# Patient Record
Sex: Female | Born: 1970
Health system: Southern US, Community
[De-identification: ages and names within clinical notes are randomized; demographics above are authoritative.]

## PROBLEM LIST (undated history)

## (undated) DIAGNOSIS — R079 Chest pain, unspecified: Secondary | ICD-10-CM

## (undated) DIAGNOSIS — D649 Anemia, unspecified: Secondary | ICD-10-CM

## (undated) DIAGNOSIS — R011 Cardiac murmur, unspecified: Secondary | ICD-10-CM

## (undated) DIAGNOSIS — Z8632 Personal history of gestational diabetes: Secondary | ICD-10-CM

## (undated) DIAGNOSIS — I82409 Acute embolism and thrombosis of unspecified deep veins of unspecified lower extremity: Secondary | ICD-10-CM

## (undated) DIAGNOSIS — I10 Essential (primary) hypertension: Secondary | ICD-10-CM

## (undated) DIAGNOSIS — N939 Abnormal uterine and vaginal bleeding, unspecified: Secondary | ICD-10-CM

## (undated) HISTORY — DX: Personal history of gestational diabetes: Z86.32

## (undated) HISTORY — DX: Chest pain, unspecified: R07.9

---
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
## 1997-05-04 ENCOUNTER — Inpatient Hospital Stay (HOSPITAL_COMMUNITY): Admission: AD | Admit: 1997-05-04 | Discharge: 1997-05-04 | Payer: Self-pay | Admitting: Gynecology

## 1997-09-30 ENCOUNTER — Encounter: Admission: RE | Admit: 1997-09-30 | Discharge: 1997-12-29 | Payer: Self-pay | Admitting: Gynecology

## 1998-01-01 ENCOUNTER — Encounter: Admission: RE | Admit: 1998-01-01 | Discharge: 1998-04-01 | Payer: Self-pay | Admitting: Obstetrics and Gynecology

## 1998-01-23 HISTORY — PX: TUBAL LIGATION: SHX77

## 1998-03-18 ENCOUNTER — Inpatient Hospital Stay (HOSPITAL_COMMUNITY): Admission: AD | Admit: 1998-03-18 | Discharge: 1998-03-20 | Payer: Self-pay | Admitting: Obstetrics and Gynecology

## 1998-04-21 ENCOUNTER — Other Ambulatory Visit: Admission: RE | Admit: 1998-04-21 | Discharge: 1998-04-21 | Payer: Self-pay | Admitting: Gynecology

## 2000-10-25 ENCOUNTER — Other Ambulatory Visit: Admission: RE | Admit: 2000-10-25 | Discharge: 2000-10-25 | Payer: Self-pay | Admitting: Gynecology

## 2001-11-15 ENCOUNTER — Other Ambulatory Visit: Admission: RE | Admit: 2001-11-15 | Discharge: 2001-11-15 | Payer: Self-pay | Admitting: Gynecology

## 2003-12-10 ENCOUNTER — Other Ambulatory Visit: Admission: RE | Admit: 2003-12-10 | Discharge: 2003-12-10 | Payer: Self-pay | Admitting: Gynecology

## 2005-01-06 ENCOUNTER — Other Ambulatory Visit: Admission: RE | Admit: 2005-01-06 | Discharge: 2005-01-06 | Payer: Self-pay | Admitting: Gynecology

## 2005-08-03 ENCOUNTER — Other Ambulatory Visit: Admission: RE | Admit: 2005-08-03 | Discharge: 2005-08-03 | Payer: Self-pay | Admitting: Gynecology

## 2006-01-11 ENCOUNTER — Other Ambulatory Visit: Admission: RE | Admit: 2006-01-11 | Discharge: 2006-01-11 | Payer: Self-pay | Admitting: Gynecology

## 2007-03-28 ENCOUNTER — Other Ambulatory Visit: Admission: RE | Admit: 2007-03-28 | Discharge: 2007-03-28 | Payer: Self-pay | Admitting: Gynecology

## 2009-03-17 ENCOUNTER — Other Ambulatory Visit: Admission: RE | Admit: 2009-03-17 | Discharge: 2009-03-17 | Payer: Self-pay | Admitting: Gynecology

## 2009-03-17 ENCOUNTER — Ambulatory Visit: Payer: Self-pay | Admitting: Gynecology

## 2010-03-18 ENCOUNTER — Encounter (INDEPENDENT_AMBULATORY_CARE_PROVIDER_SITE_OTHER): Payer: 59 | Admitting: Gynecology

## 2010-03-18 ENCOUNTER — Other Ambulatory Visit: Payer: Self-pay | Admitting: Gynecology

## 2010-03-18 ENCOUNTER — Other Ambulatory Visit (HOSPITAL_COMMUNITY)
Admission: RE | Admit: 2010-03-18 | Discharge: 2010-03-18 | Disposition: A | Discharge status: Home / Self Care | Payer: 59 | Source: Ambulatory Visit | Attending: Gynecology | Admitting: Gynecology

## 2010-03-18 DIAGNOSIS — R823 Hemoglobinuria: Secondary | ICD-10-CM

## 2010-03-18 DIAGNOSIS — Z13220 Encounter for screening for lipoid disorders: Secondary | ICD-10-CM

## 2010-03-18 DIAGNOSIS — Z124 Encounter for screening for malignant neoplasm of cervix: Secondary | ICD-10-CM | POA: Insufficient documentation

## 2010-03-18 DIAGNOSIS — Z01419 Encounter for gynecological examination (general) (routine) without abnormal findings: Secondary | ICD-10-CM

## 2010-03-18 DIAGNOSIS — N920 Excessive and frequent menstruation with regular cycle: Secondary | ICD-10-CM

## 2010-03-18 DIAGNOSIS — Z833 Family history of diabetes mellitus: Secondary | ICD-10-CM

## 2010-04-14 ENCOUNTER — Other Ambulatory Visit: Payer: Self-pay | Admitting: Gynecology

## 2010-04-14 DIAGNOSIS — Z1231 Encounter for screening mammogram for malignant neoplasm of breast: Secondary | ICD-10-CM

## 2010-04-18 ENCOUNTER — Ambulatory Visit
Admission: RE | Admit: 2010-04-18 | Discharge: 2010-04-18 | Disposition: A | Discharge status: Home / Self Care | Payer: 59 | Source: Ambulatory Visit | Attending: Gynecology | Admitting: Gynecology

## 2010-04-18 DIAGNOSIS — Z1231 Encounter for screening mammogram for malignant neoplasm of breast: Secondary | ICD-10-CM

## 2011-05-11 ENCOUNTER — Ambulatory Visit (INDEPENDENT_AMBULATORY_CARE_PROVIDER_SITE_OTHER): Payer: 59 | Admitting: Gynecology

## 2011-05-11 ENCOUNTER — Encounter: Payer: Self-pay | Admitting: Gynecology

## 2011-05-11 VITALS — BP 134/84 | Ht 67.5 in | Wt 187.0 lb

## 2011-05-11 DIAGNOSIS — Z01419 Encounter for gynecological examination (general) (routine) without abnormal findings: Secondary | ICD-10-CM

## 2011-05-11 DIAGNOSIS — D259 Leiomyoma of uterus, unspecified: Secondary | ICD-10-CM

## 2011-05-11 DIAGNOSIS — R638 Other symptoms and signs concerning food and fluid intake: Secondary | ICD-10-CM

## 2011-05-11 DIAGNOSIS — Z8632 Personal history of gestational diabetes: Secondary | ICD-10-CM | POA: Insufficient documentation

## 2011-05-11 LAB — CBC WITH DIFFERENTIAL/PLATELET
Basophils Relative: 0 % (ref 0–1)
Eosinophils Absolute: 0.2 10*3/uL (ref 0.0–0.7)
Hemoglobin: 12.4 g/dL (ref 12.0–15.0)
MCH: 31.3 pg (ref 26.0–34.0)
MCHC: 34 g/dL (ref 30.0–36.0)
Monocytes Relative: 4 % (ref 3–12)
Neutrophils Relative %: 39 % — ABNORMAL LOW (ref 43–77)

## 2011-05-11 LAB — HEMOGLOBIN A1C: Hgb A1c MFr Bld: 5.3 % (ref <5.7)

## 2011-05-11 LAB — CHOLESTEROL, TOTAL: Cholesterol: 133 mg/dL (ref 0–200)

## 2011-05-11 LAB — TSH: TSH: 0.992 u[IU]/mL (ref 0.350–4.500)

## 2011-05-11 NOTE — Patient Instructions (Signed)
Fibroids You have been diagnosed as having a fibroid. Fibroids are smooth muscle lumps (tumors) which can occur any place in a woman's body. They are usually in the womb (uterus). The most common problem (symptom) of fibroids is bleeding. Over time this may cause low red blood cells (anemia). Other symptoms include feelings of pressure and pain in the pelvis. The diagnosis (learning what is wrong) of fibroids is made by physical exam. Sometimes tests such as an ultrasound are used. This is helpful when fibroids are felt around the ovaries and to look for tumors. TREATMENT   Most fibroids do not need surgical or medical treatment. Sometimes a tissue sample (biopsy) of the lining of the uterus is done to rule out cancer. If there is no cancer and only a small amount of bleeding, the problem can be watched.   Hormonal treatment can improve the problem.   When surgery is needed, it can consist of removing the fibroid. Vaginal birth may not be possible after the removal of fibroids. This depends on where they are and the extent of surgery. When pregnancy occurs with fibroids it is usually normal.   Your caregiver can help decide which treatments are best for you.  HOME CARE INSTRUCTIONS   Do not use aspirin as this may increase bleeding problems.   If your periods (menses) are heavy, record the number of pads or tampons used per month. Bring this information to your caregiver. This can help them determine the best treatment for you.  SEEK IMMEDIATE MEDICAL CARE IF:  You have pelvic pain or cramps not controlled with medications, or experience a sudden increase in pain.   You have an increase of pelvic bleeding between and during menses.   You feel lightheaded or have fainting spells.   You develop worsening belly (abdominal) pain.  Document Released: 01/07/2000 Document Revised: 12/29/2010 Document Reviewed: 08/29/2007 [facility] Patient Information 2012 [place], [place].  Transvaginal  Ultrasound Transvaginal ultrasound is a pelvic ultrasound, using a metal probe that is placed in the vagina, to look at a women's female organs. Transvaginal ultrasound is a method of seeing inside the pelvis of a woman. The ultrasound machine sends out sound waves from the transducer (probe). These sound waves bounce off body structures (like an echo) to create a picture. The picture shows up on a monitor. It is called transvaginal because the probe is inserted into the vagina. There should be very little discomfort from the vaginal probe. This test can also be used during pregnancy. Endovaginal ultrasound is another name for a transvaginal ultrasound. In a transabdominal ultrasound, the probe is placed on the outside of the belly. This method gives pictures that are lower quality than pictures from the transvaginal technique. Transvaginal ultrasound is used to look for problems of the female genital tract. Some such problems include:  Infertility problems.   Congenital (birth defect) malformations of the uterus and ovaries.   Tumors in the uterus.   Abnormal bleeding.   Ovarian tumors and cysts.   Abscess (inflamed tissue around pus) in the pelvis.   Unexplained abdominal or pelvic pain.   Pelvic infection.  DURING PREGNANCY, TRANSVAGINAL ULTRASOUND MAY BE USED TO LOOK AT:  Normal pregnancy.   Ectopic pregnancy (pregnancy outside the uterus).   Fetal heartbeat.   Abnormalities in the pelvis, that are not seen well with transabdominal ultrasound.   Suspected twins or multiples.   Impending miscarriage.   Problems with the cervix (incompetent cervix, not able to stay closed and hold  the baby).   When doing an amniocentesis (removing fluid from the pregnancy sac, for testing).   Looking for abnormalities of the baby.   Checking the growth, development, and age of the fetus.   Measuring the amount of fluid in the amniotic sac.   When doing an external version of the baby  (moving baby into correct position).   Evaluating the baby for problems in high risk pregnancies (biophysical profile).   Suspected fetal demise (death).  Sometimes a special ultrasound method called Saline Infusion Sonography (SIS) is used for a more accurate look at the uterus. Sterile saline (salt water) is injected into the uterus of non-pregnant patients to see the inside of the uterus better. SIS is not used on pregnant women. The vaginal probe can also assist in obtaining biopsies of abnormal areas, in draining fluid from cysts on the ovary, and in finding IUDs (intrauterine device, birth control) that cannot be located. PREPARATION FOR TEST A transvaginal ultrasound is done with the bladder empty. The transabdominal ultrasound is done with your bladder full. You may be asked to drink several glasses of water before that exam. Sometimes, a transabdominal ultrasound is done just after a transvaginal ultrasound, to look at organs in your abdomen. PROCEDURE  You will lie down on a table, with your knees bent and your feet in foot holders. The probe is covered with a condom. A sterile lubricant is put into the vagina and on the probe. The lubricant helps transmit the sound waves and avoid irritating the vagina. Your caregiver will move the probe inside the vaginal cavity to scan the pelvic structures. A normal test will show a normal pelvis and normal contents. An abnormal test will show abnormalities of the pelvis, placenta, or baby. ABNORMAL RESULTS MAY BE DUE TO:  Growths or tumors in the:   Uterus.   Ovaries.   Vagina.   Other pelvic structures.   Non-cancerous growths of the uterus and ovaries.   Twisting of the ovary, cutting off blood supply to the ovary (ovarian torsion).   Areas of infection, including:   Pelvic inflammatory disease.   Abscess in the pelvis.   Locating an IUD.  PROBLEMS FOUND IN PREGNANT WOMEN MAY INCLUDE:  Ectopic pregnancy (pregnancy outside the  uterus).   Multiple pregnancies.   Early dilation (opening) of the cervix. This may indicate an incompetent cervix and early delivery.   Impending miscarriage.   Fetal death.   Problems with the placenta, including:   Placenta has grown over the opening of the womb (placenta previa).   Placenta has separated early in the womb (placental abruption).   Placenta grows into the muscle of the uterus (placenta accreta).   Tumors of pregnancy, including gestational trophoblastic disease. This is an abnormal pregnancy, with no fetus. The uterus is filled with many grape-like cysts that could sometimes be cancerous.   Incorrect position of the fetus (breech, vertex).   Intrauterine fetal growth retardation (IUGR) (poor growth in the womb).   Fetal abnormalities or infection.  RISKS AND COMPLICATIONS There are no known risks to the ultrasound procedure. There is no X-ray used when doing an ultrasound. Document Released: 12/22/2003 Document Revised: 12/29/2010 Document Reviewed: 12/09/2008 [facility] Patient Information 2012 [place], [place].  Exercise to Lose Weight Exercise and a healthy diet may help you lose weight. Your doctor may suggest specific exercises. EXERCISE IDEAS AND TIPS  Choose low-cost things you enjoy doing, such as walking, bicycling, or exercising to workout videos.   Take stairs  instead of the elevator.   Walk during your lunch break.   Park your car further away from work or school.   Go to a gym or an exercise class.   Start with 5 to 10 minutes of exercise each day. Build up to 30 minutes of exercise 4 to 6 days a week.   Wear shoes with good support and comfortable clothes.   Stretch before and after working out.   Work out until you breathe harder and your heart beats faster.   Drink extra water when you exercise.   Do not do so much that you hurt yourself, feel dizzy, or get very short of breath.  Exercises that burn about 150 calories:  Running  1  miles in 15 minutes.   Playing volleyball for 45 to 60 minutes.   Washing and waxing a car for 45 to 60 minutes.   Playing touch football for 45 minutes.   Walking 1  miles in 35 minutes.   Pushing a stroller 1  miles in 30 minutes.   Playing basketball for 30 minutes.   Raking leaves for 30 minutes.   Bicycling 5 miles in 30 minutes.   Walking 2 miles in 30 minutes.   Dancing for 30 minutes.   Shoveling snow for 15 minutes.   Swimming laps for 20 minutes.   Walking up stairs for 15 minutes.   Bicycling 4 miles in 15 minutes.   Gardening for 30 to 45 minutes.   Jumping rope for 15 minutes.   Washing windows or floors for 45 to 60 minutes.  Document Released: 02/11/2010 Document Revised: 09/21/2010 Document Reviewed: 02/11/2010 [facility] Patient Information 2012 [place], [place].

## 2011-05-11 NOTE — Progress Notes (Signed)
Samantha Moon 04-Jan-1971 [id number]   History:    41 y.o.  for annual exam with no complaints. Patient is still overweight with a BMI 28.86. Her menstrual cycles reported to be regular although that last 6 days but she said that is not as heavy as it was before. She has a previous tubal sterilization procedure done. She also had gestational diabetes in one of her pregnancies.  Her last mammogram was in March of 2012 and she does her monthly self breast examination.  Past medical history,surgical history, family history and social history were all reviewed and documented in the EPIC chart.  Gynecologic History Patient's last menstrual period was 04/18/2011. Contraception: condoms Last Pap: 2012. Results were: normal Last mammogram: 2012. Results were: normal  Obstetric History OB History    Grav Para Term Preterm Abortions TAB SAB Ect Mult Living   3 2   1  1   2      # Outc Date GA Lbr Len/2nd Wgt Sex Del Anes PTL Lv   1 PAR            2 PAR            3 SAB                ROS:  Was performed and pertinent positives and negatives are included in the history.  Exam: chaperone present  BP 134/84  Ht 5' 7.5" (1.715 m)  Wt 187 lb (84.823 kg)  BMI 28.86 kg/m2  LMP 04/18/2011  Body mass index is 28.86 kg/(m^2).  General appearance : Well developed well nourished female. No acute distress HEENT: Neck supple, trachea midline, no carotid bruits, no thyroidmegaly Lungs: Clear to auscultation, no rhonchi or wheezes, or rib retractions  Heart: Regular rate and rhythm, no murmurs or gallops Breast:Examined in sitting and supine position were symmetrical in appearance, no palpable masses or tenderness,  no skin retraction, no nipple inversion, no nipple discharge, no skin discoloration, no axillary or supraclavicular lymphadenopathy Abdomen: no palpable masses or tenderness, no rebound or guarding Extremities: no edema or skin discoloration or tenderness  Pelvic:  Bartholin, Urethra,  Skene Glands: Within normal limits             Vagina: No gross lesions or discharge  Cervix: No gross lesions or discharge  Uterus  anteverted with irregular T. noted at the fundus questionable fibroid  Adnexa  Without masses or tenderness  Anus and perineum  normal   Rectovaginal  normal sphincter tone without palpated masses or tenderness             Hemoccult not done     Assessment/Plan:  41 y.o. female for annual exam with apparent fibroid uterus. She will return back next week for an ultrasound for better assessment of her uterus and adnexa. Because of her past history of gestational diabetes and currently nonfasting we'll do a hemoglobin A1c today along with her CBC cholesterol and urinalysis. New screening guidelines for Pap smear were discussed and she will not need one for 2 more years. She was instructed to do her monthly self breast examination and to schedule her mammogram which is overdue.    Ok Edwards MD, 3:55 PM 05/11/2011

## 2011-05-12 ENCOUNTER — Other Ambulatory Visit: Payer: Self-pay | Admitting: *Deleted

## 2011-05-12 ENCOUNTER — Ambulatory Visit (INDEPENDENT_AMBULATORY_CARE_PROVIDER_SITE_OTHER): Payer: 59

## 2011-05-12 ENCOUNTER — Encounter: Payer: Self-pay | Admitting: Gynecology

## 2011-05-12 ENCOUNTER — Ambulatory Visit (INDEPENDENT_AMBULATORY_CARE_PROVIDER_SITE_OTHER): Payer: 59 | Admitting: Gynecology

## 2011-05-12 DIAGNOSIS — R3129 Other microscopic hematuria: Secondary | ICD-10-CM

## 2011-05-12 DIAGNOSIS — R7989 Other specified abnormal findings of blood chemistry: Secondary | ICD-10-CM

## 2011-05-12 DIAGNOSIS — D259 Leiomyoma of uterus, unspecified: Secondary | ICD-10-CM

## 2011-05-12 DIAGNOSIS — R8290 Unspecified abnormal findings in urine: Secondary | ICD-10-CM

## 2011-05-12 LAB — URINALYSIS W MICROSCOPIC + REFLEX CULTURE
Bacteria, UA: NONE SEEN
Casts: NONE SEEN
Glucose, UA: NEGATIVE mg/dL
Ketones, ur: NEGATIVE mg/dL
Nitrite: NEGATIVE
pH: 6 (ref 5.0–8.0)

## 2011-05-12 LAB — CBC WITH DIFFERENTIAL/PLATELET
Basophils Relative: 0 % (ref 0–1)
Eosinophils Absolute: 0.2 10*3/uL (ref 0.0–0.7)
HCT: 35.1 % — ABNORMAL LOW (ref 36.0–46.0)
Hemoglobin: 12 g/dL (ref 12.0–15.0)
Lymphs Abs: 2.1 10*3/uL (ref 0.7–4.0)
MCH: 30.9 pg (ref 26.0–34.0)
MCHC: 34.2 g/dL (ref 30.0–36.0)
Monocytes Absolute: 0.3 10*3/uL (ref 0.1–1.0)
Monocytes Relative: 6 % (ref 3–12)

## 2011-05-12 NOTE — Progress Notes (Signed)
Patient 41 year old was seen in the office on April 18 for her annual gynecological examination. Patient was asymptomatic but during the examination an irregularity was noted at the fundus of the uterus and she was asked to return today for an ultrasound.  Ultrasound report: Uterus measured 10.7 x 7.7 x 6 cm endometrial stripe 16.5 mm (patient is on day 23 of her cycle) a single anterior fibroid measuring 19 x 11 mm was noted otherwise normal ovaries and no free fluid the: Sac was noted.  Her labs were as follows her hemoglobin A1c was normal total cholesterol was normal CBC was normal with the exception of slightly decreased neutrophil and slight elevation of lymphocytes for which she is having a repeat CBC today. Her urinalysis also had demonstrated some evidence of microscopic hematuria and urinalysis will be repeated today. Will notify with the results of there is any abnormality. She was reassured and we'll see her back in one year or when necessary. She'll be reminded also to schedule her mammogram accordingly.

## 2011-05-12 NOTE — Patient Instructions (Signed)
Remember to schedule your mammogram 

## 2012-05-16 ENCOUNTER — Ambulatory Visit (INDEPENDENT_AMBULATORY_CARE_PROVIDER_SITE_OTHER): Payer: BC Managed Care – PPO | Admitting: Gynecology

## 2012-05-16 ENCOUNTER — Encounter: Payer: Self-pay | Admitting: Gynecology

## 2012-05-16 VITALS — BP 142/90 | Ht 67.0 in | Wt 187.0 lb

## 2012-05-16 DIAGNOSIS — Z8632 Personal history of gestational diabetes: Secondary | ICD-10-CM

## 2012-05-16 DIAGNOSIS — R635 Abnormal weight gain: Secondary | ICD-10-CM

## 2012-05-16 DIAGNOSIS — Z01419 Encounter for gynecological examination (general) (routine) without abnormal findings: Secondary | ICD-10-CM

## 2012-05-16 DIAGNOSIS — Z23 Encounter for immunization: Secondary | ICD-10-CM

## 2012-05-16 LAB — CBC WITH DIFFERENTIAL/PLATELET
Eosinophils Absolute: 0.3 10*3/uL (ref 0.0–0.7)
Eosinophils Relative: 5 % (ref 0–5)
HCT: 35.2 % — ABNORMAL LOW (ref 36.0–46.0)
Lymphs Abs: 2.8 10*3/uL (ref 0.7–4.0)
MCH: 30.9 pg (ref 26.0–34.0)
MCV: 90 fL (ref 78.0–100.0)
Monocytes Absolute: 0.4 10*3/uL (ref 0.1–1.0)
Platelets: 375 10*3/uL (ref 150–400)
RBC: 3.91 MIL/uL (ref 3.87–5.11)
RDW: 13.9 % (ref 11.5–15.5)

## 2012-05-16 LAB — CHOLESTEROL, TOTAL: Cholesterol: 130 mg/dL (ref 0–200)

## 2012-05-16 NOTE — Progress Notes (Signed)
Samantha Moon 12-04-1970 [id number]   History:    42 y.o.  for annual gyn exam with no complaints today. Patient with prior tubal sterilization procedure. Cycles reported to be regular. Last mammogram 2012. Patient does not examine her breasts are regular basis. Patient had history of gestational diabetes. Patient would know prior history of abnormal Pap smears. Patient uncertain of Tdap status.  Past medical history,surgical history, family history and social history were all reviewed and documented in the EPIC chart.  Gynecologic History Patient's last menstrual period was 05/09/2012. Contraception: tubal ligation Last Pap: 2012. Results were: normal Last mammogram: 2012. Results were: normal  Obstetric History OB History   Grav Para Term Preterm Abortions TAB SAB Ect Mult Living   3 2   1  1   2      # Outc Date GA Lbr Len/2nd Wgt Sex Del Anes PTL Lv   1 PAR            2 PAR            3 SAB                ROS: A ROS was performed and pertinent positives and negatives are included in the history.  GENERAL: No fevers or chills. HEENT: No change in vision, no earache, sore throat or sinus congestion. NECK: No pain or stiffness. CARDIOVASCULAR: No chest pain or pressure. No palpitations. PULMONARY: No shortness of breath, cough or wheeze. GASTROINTESTINAL: No abdominal pain, nausea, vomiting or diarrhea, melena or bright red blood per rectum. GENITOURINARY: No urinary frequency, urgency, hesitancy or dysuria. MUSCULOSKELETAL: No joint or muscle pain, no back pain, no recent trauma. DERMATOLOGIC: No rash, no itching, no lesions. ENDOCRINE: No polyuria, polydipsia, no heat or cold intolerance. No recent change in weight. HEMATOLOGICAL: No anemia or easy bruising or bleeding. NEUROLOGIC: No headache, seizures, numbness, tingling or weakness. PSYCHIATRIC: No depression, no loss of interest in normal activity or change in sleep pattern.     Exam: chaperone present  BP 142/90  Ht 5\' 7"   (1.702 m)  Wt 187 lb (84.823 kg)  BMI 29.28 kg/m2  LMP 05/09/2012  Body mass index is 29.28 kg/(m^2).  General appearance : Well developed well nourished female. No acute distress HEENT: Neck supple, trachea midline, no carotid bruits, no thyroidmegaly Lungs: Clear to auscultation, no rhonchi or wheezes, or rib retractions  Heart: Regular rate and rhythm, no murmurs or gallops Breast:Examined in sitting and supine position were symmetrical in appearance, no palpable masses or tenderness,  no skin retraction, no nipple inversion, no nipple discharge, no skin discoloration, no axillary or supraclavicular lymphadenopathy Abdomen: no palpable masses or tenderness, no rebound or guarding Extremities: no edema or skin discoloration or tenderness  Pelvic:  Bartholin, Urethra, Skene Glands: Within normal limits             Vagina: No gross lesions or discharge  Cervix: No gross lesions or discharge  Uterus  Upper limits of normal 10 week size, normal  shape and consistency, non-tender and mobile  Adnexa  Without masses or tenderness  Anus and perineum  normal   Rectovaginal  normal sphincter tone without palpated masses or tenderness             Hemoccult that indicated     Assessment/Plan:  42 y.o. female for annual exam will receive her Tdap vaccine today. The following labs her were: Hemoglobin A1c, TSH, screening cholesterol, CBC, as well as urinalysis. No Pap smear done  today the new guidelines were discussed. She was reminded on the importance of monthly self breast examination. Requisition was provided for to schedule her mammogram.    Ok Edwards MD, 2:40 PM 05/16/2012

## 2012-05-16 NOTE — Patient Instructions (Addendum)

## 2012-05-16 NOTE — Addendum Note (Signed)
Addended by: Bertram Savin A on: 05/16/2012 03:26 PM   Modules accepted: Orders

## 2012-05-17 ENCOUNTER — Other Ambulatory Visit: Payer: Self-pay | Admitting: Gynecology

## 2012-05-17 DIAGNOSIS — D72820 Lymphocytosis (symptomatic): Secondary | ICD-10-CM

## 2012-05-17 LAB — URINALYSIS W MICROSCOPIC + REFLEX CULTURE
Bacteria, UA: NONE SEEN
Bilirubin Urine: NEGATIVE
Crystals: NONE SEEN
Ketones, ur: NEGATIVE mg/dL
Specific Gravity, Urine: 1.026 (ref 1.005–1.030)
Urobilinogen, UA: 0.2 mg/dL (ref 0.0–1.0)

## 2012-05-17 LAB — URINE CULTURE: Colony Count: 70000

## 2012-05-23 ENCOUNTER — Other Ambulatory Visit: Payer: Self-pay

## 2012-05-23 DIAGNOSIS — Z1231 Encounter for screening mammogram for malignant neoplasm of breast: Secondary | ICD-10-CM

## 2012-06-07 ENCOUNTER — Ambulatory Visit: Payer: 59

## 2012-08-09 ENCOUNTER — Ambulatory Visit
Admission: RE | Admit: 2012-08-09 | Discharge: 2012-08-09 | Disposition: A | Discharge status: Home / Self Care | Payer: BC Managed Care – PPO | Source: Ambulatory Visit

## 2012-08-09 ENCOUNTER — Other Ambulatory Visit: Payer: BC Managed Care – PPO

## 2012-08-09 ENCOUNTER — Ambulatory Visit: Payer: Self-pay

## 2012-08-09 DIAGNOSIS — Z1231 Encounter for screening mammogram for malignant neoplasm of breast: Secondary | ICD-10-CM

## 2012-08-09 DIAGNOSIS — D72820 Lymphocytosis (symptomatic): Secondary | ICD-10-CM

## 2012-08-09 LAB — CBC WITH DIFFERENTIAL/PLATELET
Basophils Relative: 1 % (ref 0–1)
Eosinophils Absolute: 0.2 10*3/uL (ref 0.0–0.7)
HCT: 35.5 % — ABNORMAL LOW (ref 36.0–46.0)
Hemoglobin: 12.4 g/dL (ref 12.0–15.0)
MCH: 31.2 pg (ref 26.0–34.0)
MCHC: 34.9 g/dL (ref 30.0–36.0)
Monocytes Absolute: 0.3 10*3/uL (ref 0.1–1.0)
Monocytes Relative: 6 % (ref 3–12)
Neutrophils Relative %: 41 % — ABNORMAL LOW (ref 43–77)
RDW: 13.6 % (ref 11.5–15.5)

## 2013-11-24 ENCOUNTER — Encounter: Payer: Self-pay | Admitting: Gynecology

## 2014-01-02 ENCOUNTER — Encounter: Payer: Self-pay | Admitting: Gynecology

## 2014-07-31 ENCOUNTER — Other Ambulatory Visit: Payer: Self-pay | Admitting: Family Medicine

## 2014-07-31 DIAGNOSIS — R200 Anesthesia of skin: Secondary | ICD-10-CM

## 2015-04-22 ENCOUNTER — Other Ambulatory Visit: Payer: Self-pay

## 2015-04-22 DIAGNOSIS — Z1231 Encounter for screening mammogram for malignant neoplasm of breast: Secondary | ICD-10-CM

## 2015-05-05 ENCOUNTER — Ambulatory Visit (INDEPENDENT_AMBULATORY_CARE_PROVIDER_SITE_OTHER): Payer: Managed Care, Other (non HMO) | Admitting: Gynecology

## 2015-05-05 ENCOUNTER — Encounter: Payer: Self-pay | Admitting: Gynecology

## 2015-05-05 VITALS — BP 136/88 | Ht 67.25 in | Wt 199.0 lb

## 2015-05-05 DIAGNOSIS — Z8632 Personal history of gestational diabetes: Secondary | ICD-10-CM | POA: Diagnosis not present

## 2015-05-05 DIAGNOSIS — L739 Follicular disorder, unspecified: Secondary | ICD-10-CM | POA: Diagnosis not present

## 2015-05-05 DIAGNOSIS — Z01419 Encounter for gynecological examination (general) (routine) without abnormal findings: Secondary | ICD-10-CM | POA: Diagnosis not present

## 2015-05-05 MED ORDER — CEPHALEXIN 500 MG PO CAPS: 500.0000 mg | ORAL_CAPSULE | Freq: Two times a day (BID) | ORAL | Status: DC

## 2015-05-05 NOTE — Progress Notes (Signed)
Samantha Moon 1970-09-03 [id number]   History:    45 y.o.  for annual gyn exam with a complaint of a small knot in the center of her chest for the past few days. Patient with previous tubal sterilization. Patient otherwise doing well. She has not been seen the office since 2014 and her last Pap smear was in 2012. She reports normal menstrual cycles. Her PCP has been doing her blood work.  Past medical history,surgical history, family history and social history were all reviewed and documented in the EPIC chart.  Gynecologic History Patient's last menstrual period was 05/05/2015. Contraception: tubal ligation Last Pap: 2012. Results were: normal Last mammogram: 2014. Results were: Normal but dense  Obstetric History OB History  Gravida Para Term Preterm AB SAB TAB Ectopic Multiple Living  3 2   1 1    2     # Outcome Date GA Lbr Len/2nd Weight Sex Delivery Anes PTL Lv  3 SAB           2 Para           1 Para                ROS: A ROS was performed and pertinent positives and negatives are included in the history.  GENERAL: No fevers or chills. HEENT: No change in vision, no earache, sore throat or sinus congestion. NECK: No pain or stiffness. CARDIOVASCULAR: No chest pain or pressure. No palpitations. PULMONARY: No shortness of breath, cough or wheeze. GASTROINTESTINAL: No abdominal pain, nausea, vomiting or diarrhea, melena or bright red blood per rectum. GENITOURINARY: No urinary frequency, urgency, hesitancy or dysuria. MUSCULOSKELETAL: No joint or muscle pain, no back pain, no recent trauma. DERMATOLOGIC: No rash, no itching, no lesions. ENDOCRINE: No polyuria, polydipsia, no heat or cold intolerance. No recent change in weight. HEMATOLOGICAL: No anemia or easy bruising or bleeding. NEUROLOGIC: No headache, seizures, numbness, tingling or weakness. PSYCHIATRIC: No depression, no loss of interest in normal activity or change in sleep pattern.     Exam: chaperone present  BP  136/88 mmHg  Ht 5' 7.25" (1.708 m)  Wt 199 lb (90.266 kg)  BMI 30.94 kg/m2  LMP 05/05/2015  Body mass index is 30.94 kg/(m^2).  General appearance : Well developed well nourished female. No acute distress HEENT: Eyes: no retinal hemorrhage or exudates,  Neck supple, trachea midline, no carotid bruits, no thyroidmegaly Lungs: Clear to auscultation, no rhonchi or wheezes, or rib retractions  Heart: Regular rate and rhythm, no murmurs or gallops Breast:Examined in sitting and supine position were symmetrical in appearance, no palpable masses or tenderness,  no skin retraction, no nipple inversion, no nipple discharge, no skin discoloration, no axillary or supraclavicular lymphadenopathy Abdomen: no palpable masses or tenderness, no rebound or guarding Extremities: no edema or skin discoloration or tenderness  Pelvic:  Bartholin, Urethra, Skene Glands: Within normal limits             Vagina: Menstrual blood  Cervix: No gross lesions or discharge  Uterus  anteverted, normal size, shape and consistency, non-tender and mobile  Adnexa  Without masses or tenderness  Anus and perineum  normal   Rectovaginal  normal sphincter tone without palpated masses or tenderness             Hemoccult not indicated   (Sternal border inferior left breast a small epidermal inclusion cysts folliculitis/  Assessment/Plan:  45 y.o. female for annual exam with a small follicular cyst will be treated  with Keflex 500 mg twice a day for 10 days. She'll apply Neosporin to the area as well. She was given a requisition to schedule her overdue mammogram. We will request a three-dimensional mammogram due to the fact that in 2014 her mammogram demonstrated her breasts were dense. Her PCP has been doing her blood work. Pap smear with HPV screening done today. Patient menstruating.  Terrance Mass MD, 4:45 PM 05/05/2015

## 2015-05-05 NOTE — Patient Instructions (Signed)
Folliculitis °Folliculitis is redness, soreness, and swelling (inflammation) of the hair follicles. This condition can occur anywhere on the body. People with weakened immune systems, diabetes, or obesity have a greater risk of getting folliculitis. °CAUSES °· Bacterial infection. This is the most common cause. °· Fungal infection. °· Viral infection. °· Contact with certain chemicals, especially oils and tars. °Long-term folliculitis can result from bacteria that live in the nostrils. The bacteria may trigger multiple outbreaks of folliculitis over time. °SYMPTOMS °Folliculitis most commonly occurs on the scalp, thighs, legs, back, buttocks, and areas where hair is shaved frequently. An early sign of folliculitis is a small, white or yellow, pus-filled, itchy lesion (pustule). These lesions appear on a red, inflamed follicle. They are usually less than 0.2 inches (5 mm) wide. When there is an infection of the follicle that goes deeper, it becomes a boil or furuncle. A group of closely packed boils creates a larger lesion (carbuncle). Carbuncles tend to occur in hairy, sweaty areas of the body. °DIAGNOSIS  °Your caregiver can usually tell what is wrong by doing a physical exam. A sample may be taken from one of the lesions and tested in a lab. This can help determine what is causing your folliculitis. °TREATMENT  °Treatment may include: °· Applying warm compresses to the affected areas. °· Taking antibiotic medicines orally or applying them to the skin. °· Draining the lesions if they contain a large amount of pus or fluid. °· Laser hair removal for cases of long-lasting folliculitis. This helps to prevent regrowth of the hair. °HOME CARE INSTRUCTIONS °· Apply warm compresses to the affected areas as directed by your caregiver. °· If antibiotics are prescribed, take them as directed. Finish them even if you start to feel better. °· You may take over-the-counter medicines to relieve itching. °· Do not shave irritated  skin. °· Follow up with your caregiver as directed. °SEEK IMMEDIATE MEDICAL CARE IF:  °· You have increasing redness, swelling, or pain in the affected area. °· You have a fever. °MAKE SURE YOU: °· Understand these instructions. °· Will watch your condition. °· Will get help right away if you are not doing well or get worse. °  °This information is not intended to replace advice given to you by your health care provider. Make sure you discuss any questions you have with your health care provider. °  °Document Released: 03/20/2001 Document Revised: 01/30/2014 Document Reviewed: 04/11/2011 °Elsevier Interactive Patient Education ©2016 [employer] ° °

## 2015-05-07 LAB — PAP, TP IMAGING W/ HPV RNA, RFLX HPV TYPE 16,18/45: HPV MRNA, HIGH RISK: NOT DETECTED

## 2015-05-10 ENCOUNTER — Other Ambulatory Visit: Payer: Self-pay | Admitting: Gynecology

## 2015-05-11 ENCOUNTER — Telehealth: Payer: Self-pay | Admitting: *Deleted

## 2015-05-11 DIAGNOSIS — N6002 Solitary cyst of left breast: Secondary | ICD-10-CM

## 2015-05-11 NOTE — Telephone Encounter (Signed)
Pt was seen on 05/05/15 for breast problem treated with small follicular cyst will be treated with Keflex 500 mg twice a day for 10 days. Pt tried to schedule mammogram screening, breast center said pt will still need diag. Mammogram regardless if pt was treated at [place] for small follicular cyst. Please advise

## 2015-05-11 NOTE — Telephone Encounter (Signed)
Please schedule three-dimensional screening mammogram for this patient who has dense breasts

## 2015-05-12 NOTE — Telephone Encounter (Signed)
[facility] Center did not want to do it as screening but as diagnostic. Ok to order it diagnostic 3-D mammogram?

## 2015-05-12 NOTE — Telephone Encounter (Signed)
yes

## 2015-05-13 NOTE — Telephone Encounter (Signed)
Order placed, they will contact pt to schedule.

## 2015-05-14 NOTE — Telephone Encounter (Signed)
Appointment 06/04/15 @ 10:30am

## 2015-06-04 ENCOUNTER — Ambulatory Visit
Admission: RE | Admit: 2015-06-04 | Discharge: 2015-06-04 | Disposition: A | Discharge status: Home / Self Care | Payer: Managed Care, Other (non HMO) | Source: Ambulatory Visit | Attending: Gynecology | Admitting: Gynecology

## 2015-06-04 DIAGNOSIS — N6002 Solitary cyst of left breast: Secondary | ICD-10-CM

## 2015-08-03 ENCOUNTER — Telehealth: Payer: Self-pay | Admitting: *Deleted

## 2015-08-03 NOTE — Telephone Encounter (Signed)
She needs an office visit for a breast exam

## 2015-08-03 NOTE — Telephone Encounter (Signed)
Pt informed with the below note, pt said she will call back.

## 2015-08-03 NOTE — Telephone Encounter (Signed)
Pt was treated on 05/05/15 for breast problem treated with small follicular cyst will be treated with Keflex 500 mg twice a day for 10 days. Had diag. Mammogram in May 2017. Pt said lump is back in left breast again.  Pt asked what next? Please advise

## 2015-08-19 ENCOUNTER — Encounter: Payer: Self-pay | Admitting: Gynecology

## 2015-08-19 ENCOUNTER — Ambulatory Visit (INDEPENDENT_AMBULATORY_CARE_PROVIDER_SITE_OTHER): Payer: Managed Care, Other (non HMO) | Admitting: Gynecology

## 2015-08-19 VITALS — BP 132/88

## 2015-08-19 DIAGNOSIS — L739 Follicular disorder, unspecified: Secondary | ICD-10-CM | POA: Diagnosis not present

## 2015-08-19 NOTE — Progress Notes (Signed)
   HPI: Patient is a 45 year old who presented to the office today concerned about a recurrent problem whereby she is feels the cystic area on her left breast that comes and goes over the past few months. Patient has a very distant relative with breast cancer and had a normal mammogram in May of this year. In the past the area has been treated for folliculitis with antibiotics and has resolved. She states when she arrived today is no longer present but the scarring effect as they are wanted be to examine her. She denied any nipple discharge or any recent trauma. She's had a previous tubal sterilization procedure as having normal menstrual cycles and this is not related to her menses.   ROS: A ROS was performed and pertinent positives and negatives are included in the history.  GENERAL: No fevers or chills. HEENT: No change in vision, no earache, sore throat or sinus congestion. NECK: No pain or stiffness. CARDIOVASCULAR: No chest pain or pressure. No palpitations. PULMONARY: No shortness of breath, cough or wheeze. GASTROINTESTINAL: No abdominal pain, nausea, vomiting or diarrhea, melena or bright red blood per rectum. GENITOURINARY: No urinary frequency, urgency, hesitancy or dysuria. MUSCULOSKELETAL: No joint or muscle pain, no back pain, no recent trauma. DERMATOLOGIC: No rash, no itching, no lesions. ENDOCRINE: No polyuria, polydipsia, no heat or cold intolerance. No recent change in weight. HEMATOLOGICAL: No anemia or easy bruising or bleeding. NEUROLOGIC: No headache, seizures, numbness, tingling or weakness. PSYCHIATRIC: No depression, no loss of interest in normal activity or change in sleep pattern.   PE: Blood pressure 132/88 Gen. appearance well-developed well-nourished female with above-mentioned complaint Both breasts were examined sitting supine position there were both pendulous right breast no palpable mass or tenderness no supraclavicular axillary lymphadenopathy no nipple discharge Left  breast underneath the breast tissue around 6:00 a 3 cm darkened area from previous scarring from prior infection of her breast was noted but no flocculence no induration and no tenderness was noted. There was no masses palpated on her breast no supraclavicular axillary lymphadenopathy.  Physical Exam  Pulmonary/Chest:       Assessment Plan: Patient with past history of folliculitis of the left breast no evidence of infection today. If this reoccurs in the near future any induration I've given her the name of one of my general surgery colleagues so that she may be seen by them for possible incision and drainage. If she has any difficulty contacting their office and I will make arrangements for her to be seen when the moment this reoccurs again.    Greater than 50% of time was spent in counseling and coordinating care of this patient.   Time of consultation: 15   Minutes.

## 2015-08-19 NOTE — Patient Instructions (Signed)
Folliculitis °Folliculitis is redness, soreness, and swelling (inflammation) of the hair follicles. This condition can occur anywhere on the body. People with weakened immune systems, diabetes, or obesity have a greater risk of getting folliculitis. °CAUSES °· Bacterial infection. This is the most common cause. °· Fungal infection. °· Viral infection. °· Contact with certain chemicals, especially oils and tars. °Long-term folliculitis can result from bacteria that live in the nostrils. The bacteria may trigger multiple outbreaks of folliculitis over time. °SYMPTOMS °Folliculitis most commonly occurs on the scalp, thighs, legs, back, buttocks, and areas where hair is shaved frequently. An early sign of folliculitis is a small, white or yellow, pus-filled, itchy lesion (pustule). These lesions appear on a red, inflamed follicle. They are usually less than 0.2 inches (5 mm) wide. When there is an infection of the follicle that goes deeper, it becomes a boil or furuncle. A group of closely packed boils creates a larger lesion (carbuncle). Carbuncles tend to occur in hairy, sweaty areas of the body. °DIAGNOSIS  °Your caregiver can usually tell what is wrong by doing a physical exam. A sample may be taken from one of the lesions and tested in a lab. This can help determine what is causing your folliculitis. °TREATMENT  °Treatment may include: °· Applying warm compresses to the affected areas. °· Taking antibiotic medicines orally or applying them to the skin. °· Draining the lesions if they contain a large amount of pus or fluid. °· Laser hair removal for cases of long-lasting folliculitis. This helps to prevent regrowth of the hair. °HOME CARE INSTRUCTIONS °· Apply warm compresses to the affected areas as directed by your caregiver. °· If antibiotics are prescribed, take them as directed. Finish them even if you start to feel better. °· You may take over-the-counter medicines to relieve itching. °· Do not shave irritated  skin. °· Follow up with your caregiver as directed. °SEEK IMMEDIATE MEDICAL CARE IF:  °· You have increasing redness, swelling, or pain in the affected area. °· You have a fever. °MAKE SURE YOU: °· Understand these instructions. °· Will watch your condition. °· Will get help right away if you are not doing well or get worse. °  °This information is not intended to replace advice given to you by your health care provider. Make sure you discuss any questions you have with your health care provider. °  °Document Released: 03/20/2001 Document Revised: 01/30/2014 Document Reviewed: 04/11/2011 °Elsevier Interactive Patient Education ©2016 [employer] ° °

## 2016-05-05 ENCOUNTER — Encounter: Payer: Managed Care, Other (non HMO) | Admitting: Gynecology

## 2016-06-07 ENCOUNTER — Encounter: Payer: Self-pay | Admitting: Gynecology

## 2017-01-23 DIAGNOSIS — I82409 Acute embolism and thrombosis of unspecified deep veins of unspecified lower extremity: Secondary | ICD-10-CM

## 2017-01-23 HISTORY — DX: Acute embolism and thrombosis of unspecified deep veins of unspecified lower extremity: I82.409

## 2017-03-21 ENCOUNTER — Other Ambulatory Visit (HOSPITAL_BASED_OUTPATIENT_CLINIC_OR_DEPARTMENT_OTHER): Payer: Self-pay | Admitting: Family Medicine

## 2017-03-21 ENCOUNTER — Ambulatory Visit (HOSPITAL_BASED_OUTPATIENT_CLINIC_OR_DEPARTMENT_OTHER)
Admission: RE | Admit: 2017-03-21 | Discharge: 2017-03-21 | Disposition: A | Discharge status: Home / Self Care | Payer: Managed Care, Other (non HMO) | Source: Ambulatory Visit | Attending: Family Medicine | Admitting: Family Medicine

## 2017-03-21 DIAGNOSIS — I82431 Acute embolism and thrombosis of right popliteal vein: Secondary | ICD-10-CM | POA: Insufficient documentation

## 2017-03-21 DIAGNOSIS — M79661 Pain in right lower leg: Secondary | ICD-10-CM

## 2017-05-01 ENCOUNTER — Telehealth: Payer: Self-pay | Admitting: Hematology

## 2017-05-01 ENCOUNTER — Encounter: Payer: Self-pay | Admitting: Hematology

## 2017-05-01 NOTE — Telephone Encounter (Signed)
Appt has been scheduled for the pt to see Dr. Irene Limbo on 5/16 at 10am. Pt aware to arrive 30 minutes early. Letter mailed.

## 2017-06-06 NOTE — Progress Notes (Signed)
HEMATOLOGY/ONCOLOGY CONSULTATION NOTE  Date of Service: 06/07/17  Patient Care Team: Leighton Ruff, MD as PCP - General (Family Medicine)  CHIEF COMPLAINTS/PURPOSE OF CONSULTATION:  History of DVT   HISTORY OF PRESENTING ILLNESS:   Samantha Moon is a wonderful 47 y.o. female who has been referred to [place] by Dr Leighton Ruff for evaluation and management of history of DVT. The pt reports that she is doing well overall.   The pt reports having a DVT in her right leg in late February and was started on Xarelto with initial 3 week loading dose.. This was her first blood clot. She notes that her brother and her father both had blood clots, though her father's was related to an automobile accident. Her brother's clot was in the leg and was in the setting of obesity and hip arthritis.    She notes that a week before being diagnosed with her DVT on 03/21/17, she noticed swelling in her right leg. She then developed significant pain that felt like a charlie horse, and her leg became warm to the touch. She went to her doctor and was worked up with a Rt leg [place]. She notes that she sits for 10-12 hours a day due to her job. She notes that she took hormone based birth control 20 years ago, for 12 years. She has been pregnant 3 times, and has two children without any complications or clotting abnormalities. She has also had two hernia surgeries. She notes that her BP has been higher than normal in the past year, and she has begun amlodipine. She takes Zyrtec and Benadryl as needed for seasonal allergies.   She has seen a urologist at [facility] in January for hematuria as picked up by a urine test, and has been worked up with a [place]. She is continuing follow up and denies flank pains. She notes that the hematuria was picked up before beginning Xarelto.   She notes that her pain and much of her leg swelling has since resolved, but she has some residual leg swelling.   She notes that since  beginning Xarelto, her periods have been very heavy. She notes that the length of her periods has not changed from her normal 6-7 days.   Of note prior to the patient's visit today, pt has had Rt leg [place] completed on 03/21/17 with results revealing Deep venous thrombosis in the right popliteal vein and calf veins.   Most recent lab results (04/27/17) of CBC  is as follows: all values are WNL except for RBC at 3.82, Hgb at 11.9, HCT at 35.6.   On review of systems, pt reports stable weight, residual but decreased right leg swelling, heavy periods, and denies chest pain, SOB, DOE, skin rashes, visible hematuria, and any other symptoms.   On PMHx the pt reports 2 hernia surgeries, DVT 03/21/17 and denies any other history of blood clots. On Social Hx the pt denies smoking ever.  On Family Hx the pt reports blood clots in father and brother. She denies any blood disorders.    MEDICAL HISTORY:  Past Medical History:  Diagnosis Date  . Chest pain    DR Marlou Porch  . Hx gestational diabetes     SURGICAL HISTORY: Past Surgical History:  Procedure Laterality Date  . HERNIA REPAIR  1991  . TUBAL LIGATION  2000   BTL    SOCIAL HISTORY: Social History   Socioeconomic History  . Marital status: Married    Spouse name:  Not on file  . Number of children: Not on file  . Years of education: Not on file  . Highest education level: Not on file  Occupational History  . Not on file  Social Needs  . Financial resource strain: Not on file  . Food insecurity:    Worry: Not on file    Inability: Not on file  . Transportation needs:    Medical: Not on file    Non-medical: Not on file  Tobacco Use  . Smoking status: Never Smoker  . Smokeless tobacco: Never Used  Substance and Sexual Activity  . Alcohol use: Yes    Comment: occasional  . Drug use: No  . Sexual activity: Yes    Birth control/protection: Surgical    Comment: TUBAL LIGATION  Lifestyle  . Physical activity:    Days per week: Not  on file    Minutes per session: Not on file  . Stress: Not on file  Relationships  . Social connections:    Talks on phone: Not on file    Gets together: Not on file    Attends religious service: Not on file    Active member of club or organization: Not on file    Attends meetings of clubs or organizations: Not on file    Relationship status: Not on file  . Intimate partner violence:    Fear of current or ex partner: Not on file    Emotionally abused: Not on file    Physically abused: Not on file    Forced sexual activity: Not on file  Other Topics Concern  . Not on file  Social History Narrative  . Not on file    FAMILY HISTORY: Family History  Problem Relation Age of Onset  . Hypertension Mother   . Stroke Mother   . Hypertension Father   . Hypertension Brother   . Breast cancer Paternal Aunt     ALLERGIES:  is allergic to nitrofurantoin monohyd macro and sudafed [pseudoephedrine hcl].  MEDICATIONS:  Current Outpatient Medications  Medication Sig Dispense Refill  . amLODipine-benazepril (LOTREL) 5-10 MG capsule Take 1 capsule by mouth daily.    . cetirizine (ZYRTEC) 10 MG tablet Take 10 mg by mouth daily. Reported on 05/05/2015    . clonazePAM (KLONOPIN) 0.5 MG tablet Take 0.5 mg by mouth 2 (two) times daily as needed for anxiety. Reported on 05/05/2015    . rivaroxaban (XARELTO) 20 MG TABS tablet Take 20 mg by mouth daily with supper.     No current facility-administered medications for this visit.     REVIEW OF SYSTEMS:    10 Point review of Systems was done is negative except as noted above.  PHYSICAL EXAMINATION:  . Vitals:   06/07/17 1011  BP: 136/83  Pulse: 90  Resp: 14  Temp: 98.5 F (36.9 C)  SpO2: 100%   Filed Weights   06/07/17 1011  Weight: 200 lb (90.7 kg)   .Body mass index is 31.09 kg/m.  GENERAL:alert, in no acute distress and comfortable SKIN: no acute rashes, no significant lesions EYES: conjunctiva are pink and non-injected,  sclera anicteric OROPHARYNX: MMM, no exudates, no oropharyngeal erythema or ulceration NECK: supple, no JVD LYMPH:  no palpable lymphadenopathy in the cervical, axillary or inguinal regions LUNGS: clear to auscultation b/l with normal respiratory effort HEART: regular rate & rhythm ABDOMEN:  normoactive bowel sounds , non tender, not distended. Extremity: no pedal edema PSYCH: alert & oriented x 3 with fluent speech NEURO:  no focal motor/sensory deficits  LABORATORY DATA:  I have reviewed the data as listed  . CBC Latest Ref Rng & Units 06/07/2017 08/09/2012 05/16/2012  WBC 3.9 - 10.3 K/uL 4.4 4.0 5.3  Hemoglobin 11.6 - 15.9 g/dL 10.9(L) 12.4 12.1  Hematocrit 34.8 - 46.6 % 32.2(L) 35.5(L) 35.2(L)  Platelets 145 - 400 K/uL 350 348 375   . CBC    Component Value Date/Time   WBC 4.4 06/07/2017 1139   RBC 3.51 (L) 06/07/2017 1139   RBC 3.51 (L) 06/07/2017 1139   HGB 10.9 (L) 06/07/2017 1139   HCT 32.2 (L) 06/07/2017 1139   PLT 350 06/07/2017 1139   MCV 91.7 06/07/2017 1139   MCH 31.1 06/07/2017 1139   MCHC 33.9 06/07/2017 1139   RDW 13.0 06/07/2017 1139   LYMPHSABS 1.6 06/07/2017 1139   MONOABS 0.2 06/07/2017 1139   EOSABS 0.1 06/07/2017 1139   BASOSABS 0.0 06/07/2017 1139    . CMP Latest Ref Rng & Units 06/07/2017  Glucose 70 - 140 mg/dL 88  BUN 7 - 26 mg/dL 18  Creatinine 0.60 - 1.10 mg/dL 1.16(H)  Sodium 136 - 145 mmol/L 138  Potassium 3.5 - 5.1 mmol/L 4.0  Chloride 98 - 109 mmol/L 107  CO2 22 - 29 mmol/L 26  Calcium 8.4 - 10.4 mg/dL 9.0  Total Protein 6.4 - 8.3 g/dL 8.1  Total Bilirubin 0.2 - 1.2 mg/dL 0.3  Alkaline Phos 40 - 150 U/L 67  AST 5 - 34 U/L 29  ALT 0 - 55 U/L 17   Factor 5 leiden  Order: [id number]  Status:  Final result Visible to patient:  No (Not Released) Next appt:  None Dx:  Primary hypercoagulable state ([place]); ...  Component 12d ago  Recommendations-F5LEID: Comment   Comment: (NOTE)  Result: Negative (no mutation found)         Prothrombin gene mutation  Order: [id number]  Status:  Final result Visible to patient:  No (Not Released) Next appt:  None Dx:  Primary hypercoagulable state ([place]); ...  Component 12d ago  Recommendations-PTGENE: Comment   Comment: (NOTE)  NEGATIVE  No mutation identified.        Component     Latest Ref Rng & Units 06/07/2017  Beta-2 Glycoprotein I Ab, IgG     0 - 20 GPI IgG units <9  Beta-2-Glycoprotein I IgM     0 - 32 GPI IgM units <9  Beta-2-Glycoprotein I IgA     0 - 25 GPI IgA units <9  Anticardiolipin Ab,IgG,Qn     0 - 14 GPL U/mL <9  Anticardiolipin Ab,IgM,Qn     0 - 12 MPL U/mL <9  Anticardiolipin Ab,IgA,Qn     0 - 11 APL U/mL <9  Protein S-Functional     63 - 140 % 58 (L)  Protein C-Functional     73 - 180 % 94    Lupus anticoagulant panel  Order: [id number]  Status:  Edited Result - FINAL Visible to patient:  No (Not Released) Next appt:  None Dx:  Primary hypercoagulable state ([place]); ...   Ref Range & Units 12d ago  PTT Lupus Anticoagulant 0.0 - 51.9 sec 33.2   DRVVT 0.0 - 47.0 sec 55.6High    Lupus Anticoag Interp  Comment: VC  Comment: (NOTE)  Results are consistent with the presence of a lupus anticoagulant.  NOTE: Only persistent lupus anticoagulants are thought to be of  clinical  significance  RADIOGRAPHIC STUDIES: I have personally reviewed the radiological images as listed and agreed with the findings in the report. No results found.  ASSESSMENT & PLAN:  47 y.o. female with  1. RLE acute DVT of popliteal and calf veins. ?primary trigger factor Contributing factors - obesity .Body mass index is 31.09 kg/m., prolonged sitting for 8-10 hrs per day. PLAN -Discussed patient's most recent labs from 04/27/17, Platelets were WNL at 316k. -Would like to rule out signs of inflammation or infection.  -Advised getting up and moving around every hour or two at work, and using compression socks given that her job entails sitting for long  periods of time.  -counseled to maintain hydration, not cross her legs, regular exercise and maintaining ideal weight.  -Will order hypercoag panel of blood tests today to rule out genetic and measurable acquired predisposition for blood clots --results reviewed -- factor V leiden and prothrombin gene mutation neg. --anti cardiolipin ab, B2 glycoprotein ab neg, lupus anticoagulant positive +,  Nl protein C Borderline low Protein S activity -will need to rpt lupus anticoagulant testing and Protein S at 6 months with holding Xarelto for 3 days to determine significance. If lupus anticoagulant is persistently positive or there is evidence of protein S deficiency would need to consider long term anticoagulation . If rpt neg - would stop at 6 months of anticoagulation. --Recommend limiting ETOH, caffeine on long distance travels and walking around regularly, in addition to maintaining frequent hydration  -Would recommend to urologist completing work up in the setting of otherwise unprovoked DVT to rule out any underlying potential risk factors such as tumors.  -Recommend continuing age appropriate and symptom directed cancer screening  2) Anemia -normocytic anemia Hgb 10.9.. Heavy periods. . Lab Results  Component Value Date   IRON 96 06/07/2017   TIBC 380 06/07/2017   IRONPCTSAT 25 06/07/2017   (Iron and TIBC)  Lab Results  Component Value Date   FERRITIN 10 06/07/2017   -we will also check for anemia given increased period heaviness.  -Start Iron polysaccharide 150mg  po BID OTC -rpt labs on f/u -might need IV iron replacement if persistent heavy periods and hematuria.  Labs today RTC with Dr Irene Limbo in 4 months with labs   All of the patients questions were answered with apparent satisfaction. The patient knows to call the clinic with any problems, questions or concerns.  The toal time spent in the appt was 45 minutes and more than 50% was on counseling and direct patient cares.      Sullivan Lone MD [place] AAHIVMS [facility] Hematology/Oncology Physician [facility]  (Office):       [phone] (Work cell):  [phone] (Fax):           [phone]  06/07/2017 11:25 AM  This document serves as a record of services personally performed by Sullivan Lone, MD. It was created on his behalf by Baldwin [place], a trained medical scribe. The creation of this record is based on the scribe's personal observations and the provider's statements to them.   .I have reviewed the above documentation for accuracy and completeness, and I agree with the above. Brunetta Genera MD MS

## 2017-06-07 ENCOUNTER — Inpatient Hospital Stay: Payer: Managed Care, Other (non HMO) | Attending: Hematology | Admitting: Hematology

## 2017-06-07 ENCOUNTER — Encounter: Payer: Self-pay | Admitting: Hematology

## 2017-06-07 ENCOUNTER — Inpatient Hospital Stay: Payer: Managed Care, Other (non HMO)

## 2017-06-07 VITALS — BP 136/83 | HR 90 | Temp 98.5°F | Resp 14 | Ht 67.25 in | Wt 200.0 lb

## 2017-06-07 DIAGNOSIS — D649 Anemia, unspecified: Secondary | ICD-10-CM | POA: Diagnosis not present

## 2017-06-07 DIAGNOSIS — I10 Essential (primary) hypertension: Secondary | ICD-10-CM | POA: Diagnosis not present

## 2017-06-07 DIAGNOSIS — Z803 Family history of malignant neoplasm of breast: Secondary | ICD-10-CM | POA: Diagnosis not present

## 2017-06-07 DIAGNOSIS — D6859 Other primary thrombophilia: Secondary | ICD-10-CM | POA: Diagnosis not present

## 2017-06-07 DIAGNOSIS — I824Z1 Acute embolism and thrombosis of unspecified deep veins of right distal lower extremity: Secondary | ICD-10-CM | POA: Diagnosis not present

## 2017-06-07 DIAGNOSIS — I82431 Acute embolism and thrombosis of right popliteal vein: Secondary | ICD-10-CM | POA: Diagnosis present

## 2017-06-07 DIAGNOSIS — Z8249 Family history of ischemic heart disease and other diseases of the circulatory system: Secondary | ICD-10-CM | POA: Diagnosis not present

## 2017-06-07 DIAGNOSIS — M7989 Other specified soft tissue disorders: Secondary | ICD-10-CM | POA: Diagnosis not present

## 2017-06-07 DIAGNOSIS — Z6831 Body mass index (BMI) 31.0-31.9, adult: Secondary | ICD-10-CM | POA: Diagnosis not present

## 2017-06-07 LAB — CBC WITH DIFFERENTIAL/PLATELET
Basophils Absolute: 0 10*3/uL (ref 0.0–0.1)
Basophils Relative: 0 %
Eosinophils Absolute: 0.1 10*3/uL (ref 0.0–0.5)
Eosinophils Relative: 1 %
HEMATOCRIT: 32.2 % — AB (ref 34.8–46.6)
HEMOGLOBIN: 10.9 g/dL — AB (ref 11.6–15.9)
LYMPHS ABS: 1.6 10*3/uL (ref 0.9–3.3)
LYMPHS PCT: 35 %
MCH: 31.1 pg (ref 25.1–34.0)
MCHC: 33.9 g/dL (ref 31.5–36.0)
MCV: 91.7 fL (ref 79.5–101.0)
MONOS PCT: 5 %
Monocytes Absolute: 0.2 10*3/uL (ref 0.1–0.9)
NEUTROS PCT: 59 %
Neutro Abs: 2.6 10*3/uL (ref 1.5–6.5)
Platelets: 350 10*3/uL (ref 145–400)
RBC: 3.51 MIL/uL — AB (ref 3.70–5.45)
RDW: 13 % (ref 11.2–14.5)
WBC: 4.4 10*3/uL (ref 3.9–10.3)

## 2017-06-07 LAB — CMP (CANCER CENTER ONLY)
ALBUMIN: 3.8 g/dL (ref 3.5–5.0)
ALT: 17 U/L (ref 0–55)
AST: 29 U/L (ref 5–34)
Alkaline Phosphatase: 67 U/L (ref 40–150)
Anion gap: 5 (ref 3–11)
BILIRUBIN TOTAL: 0.3 mg/dL (ref 0.2–1.2)
BUN: 18 mg/dL (ref 7–26)
CHLORIDE: 107 mmol/L (ref 98–109)
CO2: 26 mmol/L (ref 22–29)
CREATININE: 1.16 mg/dL — AB (ref 0.60–1.10)
Calcium: 9 mg/dL (ref 8.4–10.4)
GFR, Est AFR Am: >60 mL/min (ref >60)
GFR, Estimated: 55 mL/min — ABNORMAL LOW (ref >60)
GLUCOSE: 88 mg/dL (ref 70–140)
POTASSIUM: 4 mmol/L (ref 3.5–5.1)
Sodium: 138 mmol/L (ref 136–145)
Total Protein: 8.1 g/dL (ref 6.4–8.3)

## 2017-06-07 LAB — RETICULOCYTES
RBC.: 3.51 MIL/uL — ABNORMAL LOW (ref 3.70–5.45)
Retic Count, Absolute: 38.6 10*3/uL (ref 33.7–90.7)
Retic Ct Pct: 1.1 % (ref 0.7–2.1)

## 2017-06-07 LAB — IRON AND TIBC
Iron: 96 ug/dL (ref 41–142)
SATURATION RATIOS: 25 % (ref 21–57)
TIBC: 380 ug/dL (ref 236–444)
UIBC: 284 ug/dL

## 2017-06-07 LAB — FERRITIN: FERRITIN: 10 ng/mL (ref 9–269)

## 2017-06-08 LAB — PROTEIN S ACTIVITY: PROTEIN S ACTIVITY: 58 % — AB (ref 63–140)

## 2017-06-08 LAB — PROTEIN C ACTIVITY: Protein C Activity: 94 % (ref 73–180)

## 2017-06-10 LAB — DRVVT MIX: DRVVT MIX: 47.6 s — AB (ref 0.0–47.0)

## 2017-06-10 LAB — LUPUS ANTICOAGULANT PANEL
DRVVT: 55.6 s — ABNORMAL HIGH (ref 0.0–47.0)
PTT Lupus Anticoagulant: 33.2 s (ref 0.0–51.9)

## 2017-06-10 LAB — DRVVT CONFIRM: DRVVT CONFIRM: 1.3 ratio — AB (ref 0.8–1.2)

## 2017-06-12 LAB — BETA-2-GLYCOPROTEIN I ABS, IGG/M/A
Beta-2 Glyco I IgG: <9 GPI IgG units (ref 0–20)
Beta-2-Glycoprotein I IgA: <9 GPI IgA units (ref 0–25)

## 2017-06-13 LAB — CARDIOLIPIN ANTIBODIES, IGG, IGM, IGA: Anticardiolipin IgA: <9 APL U/mL (ref 0–11)

## 2017-06-13 LAB — PROTHROMBIN GENE MUTATION

## 2017-06-13 LAB — FACTOR 5 LEIDEN

## 2017-06-21 ENCOUNTER — Telehealth: Payer: Self-pay

## 2017-06-21 NOTE — Telephone Encounter (Signed)
Per Dr. Irene Limbo, called pt to let her know that iron levels low and that he would like for her to start taking 150mg  iron polysaccharide bid OTC. Told pt that she may have to order on [facility], as it is sometimes difficult to find dosage at pharmacies. Pt may call with continued heavy periods and increased fatigue if she feels that she may need IV iron this is an option. Lups anticoagulant positive and Protein S borderline low. MD unaware of significance of these two findings at this time, but will monitor at next visit. Pt scheduled for f/u in four months 9/19 at [facility] for lab and 0920 for doctor visit. Pt confirmed appointments and plan to get iron supplement today. Reminded pt, per Dr. Irene Limbo, to hold xarelto for 3 days prior to lab work.

## 2017-06-22 ENCOUNTER — Telehealth: Payer: Self-pay

## 2017-06-22 NOTE — Telephone Encounter (Signed)
RN called patient and schedule appointment. Per 5/30 sch msg follow up and also mailed a letter with calender enclosed as a remminder

## 2017-07-06 ENCOUNTER — Ambulatory Visit (HOSPITAL_BASED_OUTPATIENT_CLINIC_OR_DEPARTMENT_OTHER)
Admission: RE | Admit: 2017-07-06 | Discharge: 2017-07-06 | Disposition: A | Discharge status: Home / Self Care | Payer: Managed Care, Other (non HMO) | Source: Ambulatory Visit | Attending: Family Medicine | Admitting: Family Medicine

## 2017-07-06 ENCOUNTER — Other Ambulatory Visit (HOSPITAL_BASED_OUTPATIENT_CLINIC_OR_DEPARTMENT_OTHER): Payer: Self-pay | Admitting: Family Medicine

## 2017-07-06 DIAGNOSIS — R609 Edema, unspecified: Secondary | ICD-10-CM

## 2017-07-06 DIAGNOSIS — M79662 Pain in left lower leg: Secondary | ICD-10-CM

## 2017-10-10 ENCOUNTER — Other Ambulatory Visit: Payer: Self-pay

## 2017-10-10 DIAGNOSIS — I82431 Acute embolism and thrombosis of right popliteal vein: Secondary | ICD-10-CM

## 2017-10-10 NOTE — Progress Notes (Signed)
HEMATOLOGY/ONCOLOGY CONSULTATION NOTE  Date of Service: 10/11/17    Patient Care Team: Leighton Ruff, MD as PCP - General (Family Medicine)  CHIEF COMPLAINTS/PURPOSE OF CONSULTATION:  History of DVT   HISTORY OF PRESENTING ILLNESS:   Samantha Moon is a wonderful 47 y.o. female who has been referred to [place] by Dr Leighton Ruff for evaluation and management of history of DVT. The pt reports that she is doing well overall.   The pt reports having a DVT in her right leg in late February and was started on Xarelto with initial 3 week loading dose.. This was her first blood clot. She notes that her brother and her father both had blood clots, though her father's was related to an automobile accident. Her brother's clot was in the leg and was in the setting of obesity and hip arthritis.    She notes that a week before being diagnosed with her DVT on 03/21/17, she noticed swelling in her right leg. She then developed significant pain that felt like a charlie horse, and her leg became warm to the touch. She went to her doctor and was worked up with a Rt leg [place]. She notes that she sits for 10-12 hours a day due to her job. She notes that she took hormone based birth control 20 years ago, for 12 years. She has been pregnant 3 times, and has two children without any complications or clotting abnormalities. She has also had two hernia surgeries. She notes that her BP has been higher than normal in the past year, and she has begun amlodipine. She takes Zyrtec and Benadryl as needed for seasonal allergies.   She has seen a urologist at [facility] in January for hematuria as picked up by a urine test, and has been worked up with a [place]. She is continuing follow up and denies flank pains. She notes that the hematuria was picked up before beginning Xarelto.   She notes that her pain and much of her leg swelling has since resolved, but she has some residual leg swelling.   She notes that since  beginning Xarelto, her periods have been very heavy. She notes that the length of her periods has not changed from her normal 6-7 days.   Of note prior to the patient's visit today, pt has had Rt leg [place] completed on 03/21/17 with results revealing Deep venous thrombosis in the right popliteal vein and calf veins.   Most recent lab results (04/27/17) of CBC  is as follows: all values are WNL except for RBC at 3.82, Hgb at 11.9, HCT at 35.6.   On review of systems, pt reports stable weight, residual but decreased right leg swelling, heavy periods, and denies chest pain, SOB, DOE, skin rashes, visible hematuria, and any other symptoms.   On PMHx the pt reports 2 hernia surgeries, DVT 03/21/17 and denies any other history of blood clots. On Social Hx the pt denies smoking ever.  On Family Hx the pt reports blood clots in father and brother. She denies any blood disorders.   Interval History:   Samantha Moon returns today for management and evaluation of her history of acute DVT. The patient's last visit with [place] was on 06/07/17. The pt reports that she is doing well overall.   The pt reports that she has no pain or new swelling in her right leg. She does endorse her baseline ankle swelling bilaterally however. Her previous DVT was found in February in  the lower right leg. She notes that in June she had the lower left leg evaluated as well, as noted below, which did not reveal a blood clot.   The pt notes that she had an unremarkable cystoscopy with urology in the interim and will follow up with 6-8 months, and is still having some blood in the urine. The pt notes that blood was found in her urine before she began Xarelto.   The pt also notes that she is continuing to have heavy periods. She has been able to take her iron pills.   The pt notes that she stopped Xarelto for nine days prior to our visit in anticipation of repeated blood work.   Of note since the patient's last visit, pt has had US Venous  completed on 07/06/17 with results revealing No evidence of deep venous thrombosis, LEFT lower extremity.  Lab results today (10/11/17) of CBC w/diff, CMP, and Reticulocytes is as follows: all values are WNL except for Glucose at 114, Creatinine at 1.02, Albumin at 3.3. 10/11/17 Ferritin is still low at 10  On review of systems, pt reports stable energy levels, stable bilateral ankle swelling, heavy periods, and denies pain the legs, and any other symptosm.   MEDICAL HISTORY:  Past Medical History:  Diagnosis Date  . Chest pain    DR Marlou Porch  . Hx gestational diabetes     SURGICAL HISTORY: Past Surgical History:  Procedure Laterality Date  . HERNIA REPAIR  1991  . TUBAL LIGATION  2000   BTL    SOCIAL HISTORY: Social History   Socioeconomic History  . Marital status: Married    Spouse name: Not on file  . Number of children: Not on file  . Years of education: Not on file  . Highest education level: Not on file  Occupational History  . Not on file  Social Needs  . Financial resource strain: Not on file  . Food insecurity:    Worry: Not on file    Inability: Not on file  . Transportation needs:    Medical: Not on file    Non-medical: Not on file  Tobacco Use  . Smoking status: Never Smoker  . Smokeless tobacco: Never Used  Substance and Sexual Activity  . Alcohol use: Yes    Comment: occasional  . Drug use: No  . Sexual activity: Yes    Birth control/protection: Surgical    Comment: TUBAL LIGATION  Lifestyle  . Physical activity:    Days per week: Not on file    Minutes per session: Not on file  . Stress: Not on file  Relationships  . Social connections:    Talks on phone: Not on file    Gets together: Not on file    Attends religious service: Not on file    Active member of club or organization: Not on file    Attends meetings of clubs or organizations: Not on file    Relationship status: Not on file  . Intimate partner violence:    Fear of current or ex  partner: Not on file    Emotionally abused: Not on file    Physically abused: Not on file    Forced sexual activity: Not on file  Other Topics Concern  . Not on file  Social History Narrative  . Not on file    FAMILY HISTORY: Family History  Problem Relation Age of Onset  . Hypertension Mother   . Stroke Mother   . Hypertension Father   .  Hypertension Brother   . Breast cancer Paternal Aunt     ALLERGIES:  is allergic to nitrofurantoin monohyd macro and sudafed [pseudoephedrine hcl].  MEDICATIONS:  Current Outpatient Medications  Medication Sig Dispense Refill  . amLODipine-benazepril (LOTREL) 5-10 MG capsule Take 1 capsule by mouth daily.    . cetirizine (ZYRTEC) 10 MG tablet Take 10 mg by mouth daily. Reported on 05/05/2015    . clonazePAM (KLONOPIN) 0.5 MG tablet Take 0.5 mg by mouth 2 (two) times daily as needed for anxiety. Reported on 05/05/2015    . rivaroxaban (XARELTO) 20 MG TABS tablet Take 20 mg by mouth daily with supper.     No current facility-administered medications for this visit.     REVIEW OF SYSTEMS:    A 10+ POINT REVIEW OF SYSTEMS WAS OBTAINED including neurology, dermatology, psychiatry, cardiac, respiratory, lymph, extremities, GI, GU, Musculoskeletal, constitutional, breasts, reproductive, HEENT.  All pertinent positives are noted in the HPI.  All others are negative.   PHYSICAL EXAMINATION:  Vitals:   10/11/17 0946  BP: (!) 143/73  Pulse: 70  Resp: 18  Temp: 98.4 F (36.9 C)  SpO2: 100%   Filed Weights   10/11/17 0946  Weight: 207 lb 8 oz (94.1 kg)   .Body mass index is 32.02 kg/m.  GENERAL:alert, in no acute distress and comfortable SKIN: no acute rashes, no significant lesions EYES: conjunctiva are pink and non-injected, sclera anicteric OROPHARYNX: MMM, no exudates, no oropharyngeal erythema or ulceration NECK: supple, no JVD LYMPH:  no palpable lymphadenopathy in the cervical, axillary or inguinal regions LUNGS: clear to  auscultation b/l with normal respiratory effort HEART: regular rate & rhythm ABDOMEN:  normoactive bowel sounds , non tender, not distended. No palpable hepatosplenomegaly.  Extremity: no pedal edema PSYCH: alert & oriented x 3 with fluent speech NEURO: no focal motor/sensory deficits   LABORATORY DATA:  I have reviewed the data as listed  . CBC Latest Ref Rng & Units 10/11/2017 06/07/2017 08/09/2012  WBC 3.9 - 10.3 K/uL 4.4 4.4 4.0  Hemoglobin 11.6 - 15.9 g/dL 11.8 10.9(L) 12.4  Hematocrit 34.8 - 46.6 % 35.0 32.2(L) 35.5(L)  Platelets 145 - 400 K/uL 304 350 348   . CBC    Component Value Date/Time   WBC 4.4 10/11/2017 0848   WBC 4.4 06/07/2017 1139   RBC 3.82 10/11/2017 0848   RBC 3.91 10/11/2017 0848   HGB 11.8 10/11/2017 0848   HCT 35.0 10/11/2017 0848   PLT 304 10/11/2017 0848   MCV 91.5 10/11/2017 0848   MCH 31.0 10/11/2017 0848   MCHC 33.9 10/11/2017 0848   RDW 14.1 10/11/2017 0848   LYMPHSABS 1.5 10/11/2017 0848   MONOABS 0.3 10/11/2017 0848   EOSABS 0.1 10/11/2017 0848   BASOSABS 0.0 10/11/2017 0848    . CMP Latest Ref Rng & Units 10/11/2017 06/07/2017  Glucose 70 - 99 mg/dL 114(H) 88  BUN 6 - 20 mg/dL 12 18  Creatinine 0.44 - 1.00 mg/dL 1.02(H) 1.16(H)  Sodium 135 - 145 mmol/L 139 138  Potassium 3.5 - 5.1 mmol/L 4.2 4.0  Chloride 98 - 111 mmol/L 104 107  CO2 22 - 32 mmol/L 27 26  Calcium 8.9 - 10.3 mg/dL 9.1 9.0  Total Protein 6.5 - 8.1 g/dL 7.6 8.1  Total Bilirubin 0.3 - 1.2 mg/dL 0.3 0.3  Alkaline Phos 38 - 126 U/L 59 67  AST 15 - 41 U/L 16 29  ALT 0 - 44 U/L 13 17   Factor 5 leiden  Order: [id number]  Status:  Final result Visible to patient:  No (Not Released) Next appt:  None Dx:  Primary hypercoagulable state ([place]); ...  Component 12d ago  Recommendations-F5LEID: Comment   Comment: (NOTE)  Result: Negative (no mutation found)        Prothrombin gene mutation  Order: [id number]  Status:  Final result Visible to patient:  No (Not Released)  Next appt:  None Dx:  Primary hypercoagulable state ([place]); ...  Component 12d ago  Recommendations-PTGENE: Comment   Comment: (NOTE)  NEGATIVE  No mutation identified.        Component     Latest Ref Rng & Units 06/07/2017  Beta-2 Glycoprotein I Ab, IgG     0 - 20 GPI IgG units <9  Beta-2-Glycoprotein I IgM     0 - 32 GPI IgM units <9  Beta-2-Glycoprotein I IgA     0 - 25 GPI IgA units <9  Anticardiolipin Ab,IgG,Qn     0 - 14 GPL U/mL <9  Anticardiolipin Ab,IgM,Qn     0 - 12 MPL U/mL <9  Anticardiolipin Ab,IgA,Qn     0 - 11 APL U/mL <9  Protein S-Functional     63 - 140 % 58 (L)  Protein C-Functional     73 - 180 % 94    Lupus anticoagulant panel  Order: [id number]  Status:  Edited Result - FINAL Visible to patient:  No (Not Released) Next appt:  None Dx:  Primary hypercoagulable state ([place]); ...   Ref Range & Units 12d ago  PTT Lupus Anticoagulant 0.0 - 51.9 sec 33.2   DRVVT 0.0 - 47.0 sec 55.6High    Lupus Anticoag Interp  Comment: VC  Comment: (NOTE)  Results are consistent with the presence of a lupus anticoagulant.  NOTE: Only persistent lupus anticoagulants are thought to be of  clinical  significance         RADIOGRAPHIC STUDIES: I have personally reviewed the radiological images as listed and agreed with the findings in the report. No results found.  ASSESSMENT & PLAN:  47 y.o. female with  1. RLE acute DVT of popliteal and calf veins. ?primary trigger factor Contributing factors - obesity .Body mass index is 32.02 kg/m., prolonged sitting for 8-10 hrs per day.  PLAN -Advised getting up and moving around every hour or two at work, and using compression socks given that her job entails sitting for long periods of time.  -counseled to maintain hydration, not cross her legs, regular exercise and maintaining ideal weight.  -Did order hypercoag panel of blood tests to rule out genetic and measurable acquired predisposition for blood clots-results  reviewed -- factor V leiden and prothrombin gene mutation neg. -Anti cardiolipin ab, B2 glycoprotein ab neg, lupus anticoagulant positive +,  Nl protein C Borderline low Protein S activity -Recommend limiting ETOH, caffeine on long distance travels and walking around regularly, in addition to maintaining frequent hydration  -Recommend continuing age appropriate and symptom directed cancer screening -Discussed pt labwork today, 10/11/17; blood counts and chemistries are stable -10/11/17 Ferritin is at 10  -Continue 150mg  Iron Polysaccharide  -Discussed that I would like to repeat US Venous of the right lower extremity, however pt would not prefer to do this at this time -The pt has completed over 6 months of Xarelto  -Will repeat Protein S and Lupus anticoagulant today as pt has held Xarelto for the last 9 days -Advised that pt return to Xarelto after blood tests today -If tests  are positive, will continue discussion to decide about long term blood thinner use -If negative, will recommend 81mg  aspirin for at least one year and will stop Xarelto  -Recommended that the pt continue to eat well, drink at least 48-64 oz of water each day, and walk 20-30 minutes each day.   -Recommended that the pt use compression socks and stay well hydrated on long distance travel -Will be happy to see this pt back as needed   2) Anemia -normocytic anemia Hgb 10.9.. Heavy periods. . Lab Results  Component Value Date   IRON 96 06/07/2017   TIBC 380 06/07/2017   IRONPCTSAT 25 06/07/2017   (Iron and TIBC)  Lab Results  Component Value Date   FERRITIN 10 (L) 10/11/2017   -we will also check for anemia given increased period heaviness.  -Start Iron polysaccharide 150mg  po BID OTC -rpt labs on f/u -might need IV iron replacement if persistent heavy periods and hematuria.   -Additional labs today. RTC with Dr Irene Limbo based on labs   All of the patients questions were answered with apparent satisfaction. The  patient knows to call the clinic with any problems, questions or concerns.  The total time spent in the appt was 30 minutes and more than 50% was on counseling and direct patient cares.    Sullivan Lone MD MS AAHIVMS [facility] Hematology/Oncology Physician [facility]  (Office):       [phone] (Work cell):  [phone] (Fax):           [phone]  10/11/2017 10:30 AM  I, Baldwin [place], am acting as a scribe for Dr. Irene Limbo  .I have reviewed the above documentation for accuracy and completeness, and I agree with the above. Brunetta Genera MD

## 2017-10-11 ENCOUNTER — Inpatient Hospital Stay: Payer: Managed Care, Other (non HMO) | Attending: Hematology | Admitting: Hematology

## 2017-10-11 ENCOUNTER — Inpatient Hospital Stay: Payer: Managed Care, Other (non HMO)

## 2017-10-11 VITALS — BP 143/73 | HR 70 | Temp 98.4°F | Resp 18 | Ht 67.5 in | Wt 207.5 lb

## 2017-10-11 DIAGNOSIS — E669 Obesity, unspecified: Secondary | ICD-10-CM | POA: Insufficient documentation

## 2017-10-11 DIAGNOSIS — D649 Anemia, unspecified: Secondary | ICD-10-CM | POA: Insufficient documentation

## 2017-10-11 DIAGNOSIS — I82431 Acute embolism and thrombosis of right popliteal vein: Secondary | ICD-10-CM | POA: Insufficient documentation

## 2017-10-11 DIAGNOSIS — Z7901 Long term (current) use of anticoagulants: Secondary | ICD-10-CM | POA: Insufficient documentation

## 2017-10-11 DIAGNOSIS — Z79899 Other long term (current) drug therapy: Secondary | ICD-10-CM | POA: Insufficient documentation

## 2017-10-11 DIAGNOSIS — N920 Excessive and frequent menstruation with regular cycle: Secondary | ICD-10-CM | POA: Diagnosis not present

## 2017-10-11 DIAGNOSIS — Z6832 Body mass index (BMI) 32.0-32.9, adult: Secondary | ICD-10-CM | POA: Insufficient documentation

## 2017-10-11 DIAGNOSIS — D6859 Other primary thrombophilia: Secondary | ICD-10-CM

## 2017-10-11 LAB — CMP (CANCER CENTER ONLY)
ALT: 13 U/L (ref 0–44)
ANION GAP: 8 (ref 5–15)
AST: 16 U/L (ref 15–41)
Albumin: 3.3 g/dL — ABNORMAL LOW (ref 3.5–5.0)
Alkaline Phosphatase: 59 U/L (ref 38–126)
BUN: 12 mg/dL (ref 6–20)
CHLORIDE: 104 mmol/L (ref 98–111)
CO2: 27 mmol/L (ref 22–32)
CREATININE: 1.02 mg/dL — AB (ref 0.44–1.00)
Calcium: 9.1 mg/dL (ref 8.9–10.3)
Glucose, Bld: 114 mg/dL — ABNORMAL HIGH (ref 70–99)
POTASSIUM: 4.2 mmol/L (ref 3.5–5.1)
SODIUM: 139 mmol/L (ref 135–145)
Total Bilirubin: 0.3 mg/dL (ref 0.3–1.2)
Total Protein: 7.6 g/dL (ref 6.5–8.1)

## 2017-10-11 LAB — FERRITIN: FERRITIN: 10 ng/mL — AB (ref 11–307)

## 2017-10-11 LAB — CBC WITH DIFFERENTIAL (CANCER CENTER ONLY)
BASOS ABS: 0 10*3/uL (ref 0.0–0.1)
BASOS PCT: 0 %
EOS ABS: 0.1 10*3/uL (ref 0.0–0.5)
Eosinophils Relative: 2 %
HCT: 35 % (ref 34.8–46.6)
Hemoglobin: 11.8 g/dL (ref 11.6–15.9)
Lymphocytes Relative: 34 %
Lymphs Abs: 1.5 10*3/uL (ref 0.9–3.3)
MCH: 31 pg (ref 25.1–34.0)
MCHC: 33.9 g/dL (ref 31.5–36.0)
MCV: 91.5 fL (ref 79.5–101.0)
Monocytes Absolute: 0.3 10*3/uL (ref 0.1–0.9)
Monocytes Relative: 7 %
Neutro Abs: 2.5 10*3/uL (ref 1.5–6.5)
Neutrophils Relative %: 57 %
PLATELETS: 304 10*3/uL (ref 145–400)
RBC: 3.82 MIL/uL (ref 3.70–5.45)
RDW: 14.1 % (ref 11.2–14.5)
WBC: 4.4 10*3/uL (ref 3.9–10.3)

## 2017-10-11 LAB — IRON AND TIBC
IRON: 82 ug/dL (ref 41–142)
SATURATION RATIOS: 22 % (ref 21–57)
TIBC: 364 ug/dL (ref 236–444)
UIBC: 282 ug/dL

## 2017-10-11 LAB — RETICULOCYTES
RBC.: 3.91 MIL/uL (ref 3.87–5.11)
RETIC COUNT ABSOLUTE: 46.9 10*3/uL (ref 19.0–186.0)
RETIC CT PCT: 1.2 % (ref 0.4–3.1)

## 2017-10-12 LAB — PROTEIN S PANEL
PROTEIN S AG FREE: 72 % (ref 57–157)
Protein S Activity: 60 % — ABNORMAL LOW (ref 63–140)
Protein S Ag, Total: 77 % (ref 60–150)

## 2017-10-12 LAB — LUPUS ANTICOAGULANT PANEL
DRVVT: 33.8 s (ref 0.0–47.0)
PTT Lupus Anticoagulant: 30.2 s (ref 0.0–51.9)

## 2017-10-19 ENCOUNTER — Ambulatory Visit: Payer: Managed Care, Other (non HMO) | Admitting: Hematology

## 2017-10-19 ENCOUNTER — Other Ambulatory Visit: Payer: Managed Care, Other (non HMO)

## 2017-11-06 ENCOUNTER — Telehealth: Payer: Self-pay | Admitting: *Deleted

## 2017-11-06 NOTE — Telephone Encounter (Signed)
error 

## 2017-11-07 ENCOUNTER — Telehealth: Payer: Self-pay | Admitting: *Deleted

## 2017-11-07 NOTE — Telephone Encounter (Signed)
Patient left VM requesting lab results from 10/11/17. Sent In-Basket message to Dr. Irene Limbo with patient's request.  Contacted patient and told her MD had been given her request.

## 2017-11-16 ENCOUNTER — Telehealth: Payer: Self-pay | Admitting: Hematology

## 2017-11-16 NOTE — Telephone Encounter (Signed)
Patient with history of right lower extremity DVT of popliteal and calf veins related to prolonged immobility sitting 8 to 10 hours/day and some obesity. Work-up initially showed lupus anticoagulant which was likely false positive in the setting of using Xarelto.  Repeat lupus anticoagulant is now negative suggesting this is not an active thrombotic risk factor. Her protein S activity levels are borderline low but appears somewhat improved at 60 with normal total protein and free protein S.  Probably borderline normal in the setting of hormonal variations or other stressors.  Unclear degree of significance.  Considering that her primary event was relatively low risk and the patient's preference, heavy menstrual bleeding with iron deficiency and the absence of clear ongoing significant risk factors for thrombosis I discussed the above results with the patient and recommended that she can switch from Xarelto to baby aspirin 81 mg p.o. daily to be taken for at least one year and long-term if tolerated.  Continue iron polysaccharide 150 mg p.o. twice daily and follow-up with the primary care physician for monitoring and management of iron deficiency anemia.  We shall see the patient in the future on an as-needed basis if any new questions or concerns arise.  Brunetta Genera

## 2017-11-29 ENCOUNTER — Ambulatory Visit (INDEPENDENT_AMBULATORY_CARE_PROVIDER_SITE_OTHER): Payer: Managed Care, Other (non HMO) | Admitting: Gynecology

## 2017-11-29 ENCOUNTER — Encounter: Payer: Self-pay | Admitting: Gynecology

## 2017-11-29 VITALS — BP 124/76

## 2017-11-29 DIAGNOSIS — N926 Irregular menstruation, unspecified: Secondary | ICD-10-CM

## 2017-11-29 MED ORDER — MEGESTROL ACETATE 20 MG PO TABS: 20.0000 mg | ORAL_TABLET | Freq: Two times a day (BID) | ORAL | 0 refills | Status: DC

## 2017-11-29 NOTE — Progress Notes (Signed)
    Samantha Moon [birth year] [id number]        47 y.o.  [id number] presents starting her menses in October having had regular monthly menses until and has bled on and off at times very heavily since.  No hot flushes sweats or significant pelvic pain.  Tubal sterilization.  No history of irregular bleeding in the past.  Review of records show ultrasound 2015 with uterus measuring generous in size with one small myoma identified.  Past medical history,surgical history, problem list, medications, allergies, family history and social history were all reviewed and documented in the EPIC chart.  Directed ROS with pertinent positives and negatives documented in the history of present illness/assessment and plan.  Exam: Samantha Moon assistant Vitals:   11/29/17 1029  BP: 124/76   General appearance:  Normal Abdomen soft nontender without masses guarding rebound Pelvic external BUS vagina with menstrual type flow.  Cervix normal.  Uterus generous in size midline mobile nontender.  Adnexa without masses or tenderness.  Assessment/Plan:  48 y.o. [id number] with single episode of heavy irregular bleeding.  Will check baseline labs to include CBC, FSH TSH and qualitative hCG.  Recommend Megace 20 mg twice daily x5 days then daily x5 days.  We will keep bleeding calendar after and follow-up if irregular bleeding continues.  We discussed differential to include hormonal imbalance, structural such as polyps or myomas, pregnancy related and abnormal tissue growth such as hyperplasia.  If irregular bleeding continues after progesterone challenge then will receive with a more involved evaluation starting with sonohysterogram.    Anastasio Auerbach MD, 10:44 AM 11/29/2017

## 2017-11-29 NOTE — Patient Instructions (Signed)
Take the prescribed progesterone medication twice daily for 5 days then daily for 5 days.  Call if irregular bleeding continues and we will begin a more involved evaluation.

## 2017-11-30 LAB — CBC WITH DIFFERENTIAL/PLATELET
Basophils Absolute: 22 cells/uL (ref 0–200)
Basophils Relative: 0.4 %
EOS ABS: 88 {cells}/uL (ref 15–500)
Eosinophils Relative: 1.6 %
HCT: 30.5 % — ABNORMAL LOW (ref 35.0–45.0)
Hemoglobin: 10 g/dL — ABNORMAL LOW (ref 11.7–15.5)
Lymphs Abs: 1249 cells/uL (ref 850–3900)
MCH: 30.4 pg (ref 27.0–33.0)
MCHC: 32.8 g/dL (ref 32.0–36.0)
MCV: 92.7 fL (ref 80.0–100.0)
MPV: 9.6 fL (ref 7.5–12.5)
Monocytes Relative: 4.7 %
NEUTROS PCT: 70.6 %
Neutro Abs: 3883 cells/uL (ref 1500–7800)
Platelets: 395 10*3/uL (ref 140–400)
RBC: 3.29 10*6/uL — AB (ref 3.80–5.10)
RDW: 13.2 % (ref 11.0–15.0)
Total Lymphocyte: 22.7 %
WBC: 5.5 10*3/uL (ref 3.8–10.8)
WBCMIX: 259 {cells}/uL (ref 200–950)

## 2017-11-30 LAB — FOLLICLE STIMULATING HORMONE: FSH: 12.5 m[IU]/mL

## 2017-11-30 LAB — TSH: TSH: 0.97 mIU/L

## 2017-11-30 LAB — HCG, SERUM, QUALITATIVE: PREG SERUM: NEGATIVE

## 2017-12-05 ENCOUNTER — Telehealth: Payer: Self-pay | Admitting: *Deleted

## 2017-12-05 NOTE — Telephone Encounter (Signed)
Patient informed with all lab results on 11/29/17.

## 2017-12-28 ENCOUNTER — Encounter: Payer: Self-pay | Admitting: Gynecology

## 2017-12-28 ENCOUNTER — Ambulatory Visit (INDEPENDENT_AMBULATORY_CARE_PROVIDER_SITE_OTHER): Payer: Managed Care, Other (non HMO) | Admitting: Gynecology

## 2017-12-28 VITALS — BP 138/82 | Ht 67.0 in | Wt 204.0 lb

## 2017-12-28 DIAGNOSIS — Z01419 Encounter for gynecological examination (general) (routine) without abnormal findings: Secondary | ICD-10-CM

## 2017-12-28 DIAGNOSIS — N926 Irregular menstruation, unspecified: Secondary | ICD-10-CM

## 2017-12-28 NOTE — Patient Instructions (Signed)
Follow-up for the ultrasound as scheduled. 

## 2017-12-28 NOTE — Progress Notes (Signed)
Samantha Moon Jun 30, 1970 [id number]        47 y.o.  [id number] for annual gynecologic exam.  Former patient of Dr. Toney Rakes.  Recently seen in early November with a history of regular monthly menses until October when she started bleeding on and off at times heavily.  History of tubal sterilization.  No history of irregular bleeding in the past.  Had an ultrasound 2015 which showed the uterus measuring generous in size but only showing a small single myoma.  CBC showed hemoglobin 11 with a normal FSH and TSH and negative hCG.  She took Megace 20 mg daily x5 days.  She is no longer having any heavy bleeding but has had some spotting on and off.  She also developed pain in her left leg several weeks after the Megace and she suspected she had another DVT and started herself on Eliquis that she was on previously earlier this year when she developed a DVT in her right leg.  She did have pain in her left leg in June with ultrasound studies negative.  She did not see anybody this time before starting the Eliquis because she felt convinced that she had the DVT.  Past medical history,surgical history, problem list, medications, allergies, family history and social history were all reviewed and documented as reviewed in the EPIC chart.  ROS:  Performed with pertinent positives and negatives included in the history, assessment and plan.   Additional significant findings : None   Exam: Wandra Scot assistant Vitals:   12/28/17 1626  BP: 138/82  Weight: 204 lb (92.5 kg)  Height: 5\' 7"  (1.702 m)   Body mass index is 31.95 kg/m.  General appearance:  Normal affect, orientation and appearance. Skin: Grossly normal HEENT: Without gross lesions.  No cervical or supraclavicular adenopathy. Thyroid normal.  Lungs:  Clear without wheezing, rales or rhonchi Cardiac: RR, without RMG Abdominal:  Soft, nontender, without masses, guarding, rebound, organomegaly or hernia Breasts:  Examined lying and sitting  without masses, retractions, discharge or axillary adenopathy. Pelvic:  Ext, BUS, Vagina: Normal with scant bleeding  Cervix: Normal  Uterus: Grossly normal midline mobile nontender  Adnexa: Without masses or tenderness    Anus and perineum: Normal   Rectovaginal: Normal sphincter tone without palpated masses or tenderness.    Assessment/Plan:  47 y.o. [id number] female for annual gynecologic exam   1. Irregular bleeding.  Her spotting continues on and off.  Having regular monthly menses up until October.  Negative FSH TSH.  Hemoglobin at 11.  Currently on Eliquis with question of left DVT historically.  Recommended patient follow-up with her physician who managed her DVT before so they can make a decision as far as her medication and any further work-up.  She notes her pain and symptoms are resolved now.  She had an extensive coagulation work-up previously with negative genetic findings.  We discussed if indeed she did have a DVT whether this was related to the 5 days of Megace as it did not follow immediately thereafter and had a relatively low dose.  Need to avoid hormonal treatments discussed although if she continues on anticoagulation may be possible.  Whether Mirena IUD would be a possibility also discussed.  Recommended we start with evaluation of sonohysterogram for cavitary assessment.  Patient will schedule in follow-up for this. 2. Mammography 05/2015.  Recommended patient schedule mammogram now and she agrees to do so.  Breast exam normal today. 3. Pap smear/HPV 04/2015.  No Pap smear done  today.  Plan repeat Pap smear at 5-year interval per current screening guidelines. 4. Maintenance.  Patient reports routine blood work done elsewhere.  Follow-up for sonohysterogram.  Follow-up for annual exam in 1 year.   Anastasio Auerbach MD, 4:50 PM 12/28/2017

## 2018-01-10 ENCOUNTER — Encounter: Payer: Managed Care, Other (non HMO) | Admitting: Gynecology

## 2018-01-21 ENCOUNTER — Other Ambulatory Visit: Payer: Self-pay | Admitting: Gynecology

## 2018-01-21 DIAGNOSIS — N939 Abnormal uterine and vaginal bleeding, unspecified: Secondary | ICD-10-CM

## 2018-01-30 ENCOUNTER — Ambulatory Visit (INDEPENDENT_AMBULATORY_CARE_PROVIDER_SITE_OTHER): Payer: BLUE CROSS/BLUE SHIELD

## 2018-01-30 ENCOUNTER — Encounter: Payer: Self-pay | Admitting: Gynecology

## 2018-01-30 ENCOUNTER — Ambulatory Visit (INDEPENDENT_AMBULATORY_CARE_PROVIDER_SITE_OTHER): Payer: BLUE CROSS/BLUE SHIELD | Admitting: Gynecology

## 2018-01-30 VITALS — BP 140/88

## 2018-01-30 DIAGNOSIS — N926 Irregular menstruation, unspecified: Secondary | ICD-10-CM | POA: Diagnosis not present

## 2018-01-30 DIAGNOSIS — N939 Abnormal uterine and vaginal bleeding, unspecified: Secondary | ICD-10-CM | POA: Diagnosis not present

## 2018-01-30 DIAGNOSIS — N840 Polyp of corpus uteri: Secondary | ICD-10-CM | POA: Diagnosis not present

## 2018-01-30 NOTE — Patient Instructions (Signed)
Office will call you with biopsy results 

## 2018-01-30 NOTE — Progress Notes (Signed)
    Samantha Moon [birth year] [id number]        47 y.o.  [id number] presents for sonohysterogram.  History of regular monthly menses through October and then started bleeding on and off.  Had a normal FSH and TSH with negative hCG.  Had a regular menses beginning of December.  Not bleeding now.  Had been on Xarelto but this is been discontinued and now she is on aspirin.  Past medical history,surgical history, problem list, medications, allergies, family history and social history were all reviewed and documented in the EPIC chart.  Directed ROS with pertinent positives and negatives documented in the history of present illness/assessment and plan.  Exam: Pam [place] assistant Vitals:   01/30/18 1257  BP: 140/88   General appearance:  Normal Abdomen soft nontender without masses guarding rebound Pelvic external BUS vagina normal.  Cervix normal.  Uterus normal size midline mobile nontender.  Adnexa without masses or tenderness.  Ultrasound transvaginal and transabdominal shows uterus grossly normal with several small myomas measuring 24 mm and 22 mm.  Endometrial echo 15 mm secretory in appearance.  Right ovary with small echo-free cyst 26 x 21 mm with negative peripheral flow.  Left ovary normal.  Cul-de-sac negative.  Sonohysterogram performed, sterile technique, easy catheter introduction, good distention with no abnormalities.  Endometrial sample taken.  Patient tolerated well.  Assessment/Plan:  48 y.o. [id number] with episode of regular menses followed with 2 months of irregular bleeding now seemingly resolved.  Sonohysterogram shows several small myomas and physiologic changes in the ovaries.  Endometrial biopsy was taken.  Patient will follow-up for results.  Will keep menstrual calendar.  As long as resuming regular menses will follow.  If irregular bleeding continues she will represent for evaluation.    Anastasio Auerbach MD, 1:03 PM 01/30/2018

## 2018-02-13 ENCOUNTER — Other Ambulatory Visit: Payer: Self-pay | Admitting: Gynecology

## 2018-02-13 DIAGNOSIS — Z1231 Encounter for screening mammogram for malignant neoplasm of breast: Secondary | ICD-10-CM

## 2018-03-14 ENCOUNTER — Ambulatory Visit
Admission: RE | Admit: 2018-03-14 | Discharge: 2018-03-14 | Disposition: A | Discharge status: Home / Self Care | Payer: BLUE CROSS/BLUE SHIELD | Source: Ambulatory Visit | Attending: Gynecology | Admitting: Gynecology

## 2018-03-14 DIAGNOSIS — Z1231 Encounter for screening mammogram for malignant neoplasm of breast: Secondary | ICD-10-CM

## 2018-05-10 DIAGNOSIS — D6859 Other primary thrombophilia: Secondary | ICD-10-CM | POA: Diagnosis not present

## 2018-05-10 DIAGNOSIS — I10 Essential (primary) hypertension: Secondary | ICD-10-CM | POA: Diagnosis not present

## 2018-05-10 DIAGNOSIS — F419 Anxiety disorder, unspecified: Secondary | ICD-10-CM | POA: Diagnosis not present

## 2018-05-10 DIAGNOSIS — Z0000 Encounter for general adult medical examination without abnormal findings: Secondary | ICD-10-CM | POA: Diagnosis not present

## 2018-10-15 ENCOUNTER — Encounter: Payer: Self-pay | Admitting: Gynecology

## 2019-01-01 ENCOUNTER — Other Ambulatory Visit: Payer: Self-pay

## 2019-01-02 ENCOUNTER — Encounter: Payer: Self-pay | Admitting: Gynecology

## 2019-01-02 ENCOUNTER — Ambulatory Visit (INDEPENDENT_AMBULATORY_CARE_PROVIDER_SITE_OTHER): Payer: BC Managed Care – PPO | Admitting: Gynecology

## 2019-01-02 VITALS — BP 140/90 | Ht 67.5 in | Wt 215.0 lb

## 2019-01-02 DIAGNOSIS — Z01419 Encounter for gynecological examination (general) (routine) without abnormal findings: Secondary | ICD-10-CM | POA: Diagnosis not present

## 2019-01-02 DIAGNOSIS — Z13220 Encounter for screening for lipoid disorders: Secondary | ICD-10-CM

## 2019-01-02 LAB — LIPID PANEL
Cholesterol: 146 mg/dL (ref <200)
HDL: 50 mg/dL (ref >=50)
LDL Cholesterol (Calc): 82 mg/dL (calc)
Non-HDL Cholesterol (Calc): 96 mg/dL (calc) (ref <130)
Total CHOL/HDL Ratio: 2.9 (calc) (ref <5.0)
Triglycerides: 62 mg/dL (ref <150)

## 2019-01-02 LAB — CBC WITH DIFFERENTIAL/PLATELET
Absolute Monocytes: 409 cells/uL (ref 200–950)
Basophils Absolute: 19 cells/uL (ref 0–200)
Basophils Relative: 0.4 %
Eosinophils Absolute: 169 cells/uL (ref 15–500)
Eosinophils Relative: 3.6 %
HCT: 37.2 % (ref 35.0–45.0)
Hemoglobin: 12.8 g/dL (ref 11.7–15.5)
Lymphs Abs: 2002 cells/uL (ref 850–3900)
MCH: 32 pg (ref 27.0–33.0)
MCHC: 34.4 g/dL (ref 32.0–36.0)
MCV: 93 fL (ref 80.0–100.0)
MPV: 9.7 fL (ref 7.5–12.5)
Monocytes Relative: 8.7 %
Neutro Abs: 2101 cells/uL (ref 1500–7800)
Neutrophils Relative %: 44.7 %
Platelets: 359 10*3/uL (ref 140–400)
RBC: 4 10*6/uL (ref 3.80–5.10)
RDW: 13.2 % (ref 11.0–15.0)
Total Lymphocyte: 42.6 %
WBC: 4.7 10*3/uL (ref 3.8–10.8)

## 2019-01-02 LAB — COMPREHENSIVE METABOLIC PANEL
AG Ratio: 1.1 (calc) (ref 1.0–2.5)
ALT: 16 U/L (ref 6–29)
AST: 21 U/L (ref 10–35)
Albumin: 3.9 g/dL (ref 3.6–5.1)
Alkaline phosphatase (APISO): 59 U/L (ref 31–125)
BUN/Creatinine Ratio: 11 (calc) (ref 6–22)
BUN: 13 mg/dL (ref 7–25)
CO2: 27 mmol/L (ref 20–32)
Calcium: 9.3 mg/dL (ref 8.6–10.2)
Chloride: 103 mmol/L (ref 98–110)
Creat: 1.18 mg/dL — ABNORMAL HIGH (ref 0.50–1.10)
Globulin: 3.7 g/dL (calc) (ref 1.9–3.7)
Glucose, Bld: 91 mg/dL (ref 65–99)
Potassium: 4.8 mmol/L (ref 3.5–5.3)
Sodium: 139 mmol/L (ref 135–146)
Total Bilirubin: 0.5 mg/dL (ref 0.2–1.2)
Total Protein: 7.6 g/dL (ref 6.1–8.1)

## 2019-01-02 NOTE — Progress Notes (Signed)
    Samantha Moon [birth year] [id number]        48 y.o.  [id number] for annual gynecologic exam.  Without gynecologic complaints.  History of menorrhagia previously.  Sonohysterogram earlier this year was normal in appearance.  Endometrial biopsy did show endometrial polyp that was not seen on cavity review.  Patient notes that her menses are much lighter and regular now.  Past medical history,surgical history, problem list, medications, allergies, family history and social history were all reviewed and documented as reviewed in the EPIC chart.  ROS:  Performed with pertinent positives and negatives included in the history, assessment and plan.   Additional significant findings : None   Exam: Caryn Bee assistant Vitals:   01/02/19 1004  BP: 140/90  Weight: 215 lb (97.5 kg)  Height: 5' 7.5" (1.715 m)   Body mass index is 33.18 kg/m.  General appearance:  Normal affect, orientation and appearance. Skin: Grossly normal HEENT: Without gross lesions.  No cervical or supraclavicular adenopathy. Thyroid normal.  Lungs:  Clear without wheezing, rales or rhonchi Cardiac: RR, without RMG Abdominal:  Soft, nontender, without masses, guarding, rebound, organomegaly or hernia Breasts:  Examined lying and sitting without masses, retractions, discharge or axillary adenopathy. Pelvic:  Ext, BUS, Vagina: Normal  Cervix: Normal  Uterus: Grossly normal size midline mobile nontender  Adnexa: Without masses or tenderness    Anus and perineum: Normal   Rectovaginal: Normal sphincter tone without palpated masses or tenderness.    Assessment/Plan:  48 y.o. [id number] female for annual gynecologic exam.  With regular menses, tubal sterilization  1. Mammography 02/2018.  Continue with annual mammography when due.  Breast exam normal today. 2. Pap smear/HPV 04/2015.  No Pap smear done today.  Plan repeat Pap smear/HPV at 5-year interval per current screening guidelines. 3. Health maintenance.  Blood pressure  140/90 reviewed with patient.  Recommend recheck in nonexam situation.  If remains elevated will follow-up with her primary provider.  Has not had routine blood work done this year due to [employer].  Will check baseline CBC, CMP and lipid profile.  Follow-up in 1 year, sooner as needed.   Anastasio Auerbach MD, 10:22 AM 01/02/2019

## 2019-01-02 NOTE — Patient Instructions (Addendum)
Recheck your blood pressure.  Follow-up with your primary provider if it remains elevated.  Follow-up in 1 year for annual exam

## 2019-01-06 ENCOUNTER — Other Ambulatory Visit: Payer: Self-pay | Admitting: *Deleted

## 2019-01-06 ENCOUNTER — Encounter: Payer: Self-pay | Admitting: *Deleted

## 2019-01-06 DIAGNOSIS — R748 Abnormal levels of other serum enzymes: Secondary | ICD-10-CM

## 2019-01-22 DIAGNOSIS — E878 Other disorders of electrolyte and fluid balance, not elsewhere classified: Secondary | ICD-10-CM | POA: Diagnosis not present

## 2019-01-23 NOTE — Telephone Encounter (Signed)
Patient my chart message came back unread, I informed patient with recommendations. Patient said her PCP told her to come have labs done and this was done yesterday.

## 2019-02-06 DIAGNOSIS — L309 Dermatitis, unspecified: Secondary | ICD-10-CM | POA: Diagnosis not present

## 2019-02-06 DIAGNOSIS — L304 Erythema intertrigo: Secondary | ICD-10-CM | POA: Diagnosis not present

## 2019-02-08 ENCOUNTER — Other Ambulatory Visit: Payer: Self-pay

## 2019-02-08 DIAGNOSIS — Z20822 Contact with and (suspected) exposure to covid-19: Secondary | ICD-10-CM | POA: Diagnosis not present

## 2019-02-09 LAB — NOVEL CORONAVIRUS, NAA: SARS-CoV-2, NAA: NOT DETECTED

## 2019-03-29 ENCOUNTER — Ambulatory Visit: Payer: Self-pay | Attending: Internal Medicine

## 2019-03-29 DIAGNOSIS — Z23 Encounter for immunization: Secondary | ICD-10-CM | POA: Insufficient documentation

## 2019-03-29 NOTE — Progress Notes (Signed)
   Covid-19 Vaccination Clinic  Name:  Samantha Moon    MRN: [id number] DOB: [birth year]  03/29/2019  [name] was observed post Covid-19 immunization for 15 minutes without incident. She was provided with Vaccine Information Sheet and instruction to access the V-Safe system.   [name] was instructed to call 911 with any severe reactions post vaccine: Marland Kitchen Difficulty breathing  . Swelling of face and throat  . A fast heartbeat  . A bad rash all over body  . Dizziness and weakness   Immunizations Administered    Name Date Dose VIS Date Route   Pfizer COVID-19 Vaccine 03/29/2019 11:36 AM 0.3 mL 01/03/2019 Intramuscular   Manufacturer: [place]   Lot: [id number]   [place]: [id number]

## 2019-04-19 ENCOUNTER — Ambulatory Visit: Payer: Self-pay | Attending: Internal Medicine

## 2019-04-19 DIAGNOSIS — Z23 Encounter for immunization: Secondary | ICD-10-CM

## 2019-04-19 NOTE — Progress Notes (Signed)
   Covid-19 Vaccination Clinic  Name:  Samantha Moon    MRN: [id number] DOB: 02-22-1970  04/19/2019  [name] was observed post Covid-19 immunization for 15 minutes without incident. She was provided with Vaccine Information Sheet and instruction to access the V-Safe system.   [name] was instructed to call 911 with any severe reactions post vaccine: Marland Kitchen Difficulty breathing  . Swelling of face and throat  . A fast heartbeat  . A bad rash all over body  . Dizziness and weakness   Immunizations Administered    Name Date Dose VIS Date Route   Pfizer COVID-19 Vaccine 04/19/2019  8:53 AM 0.3 mL 01/03/2019 Intramuscular   Manufacturer: [place]   Lot: [id number]   [place]: [id number]

## 2019-06-13 IMAGING — MG DIGITAL SCREENING BILATERAL MAMMOGRAM WITH TOMO AND CAD
8 series · 8 of 24 positions shown · non-contrast
Comparison: Previous exam(s).

CLINICAL DATA: Screening.

EXAM:
DIGITAL SCREENING BILATERAL MAMMOGRAM WITH TOMO AND CAD

[R MLO synth-2D]
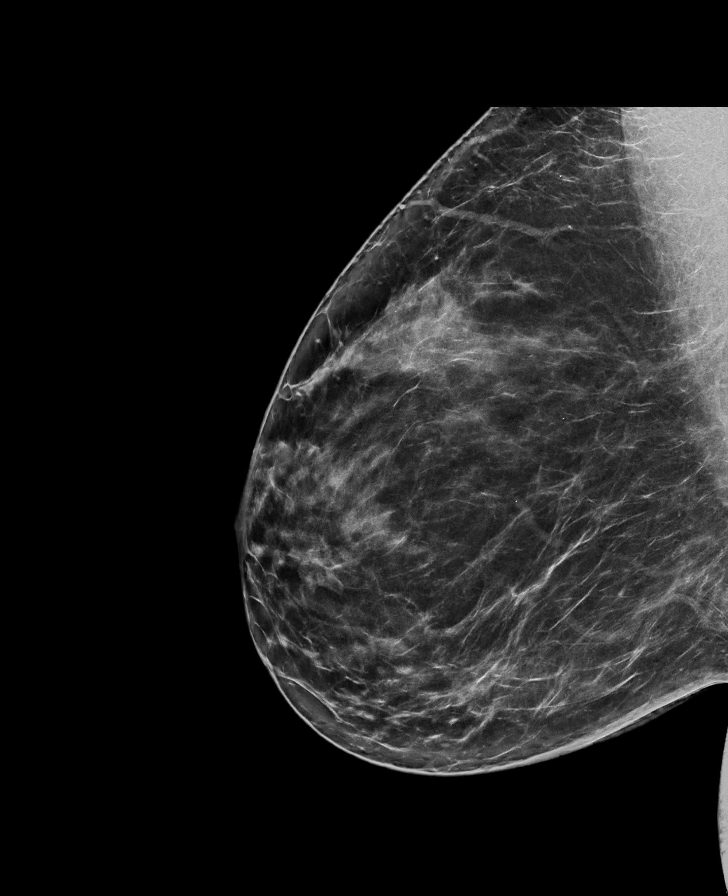

[L MLO synth-2D]
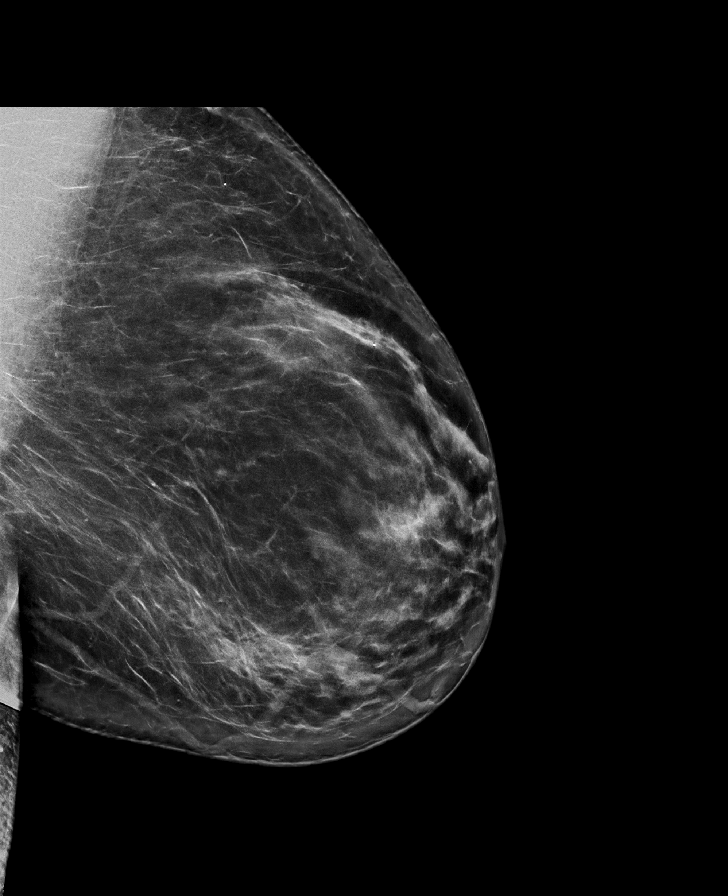

[L CC synth-2D]
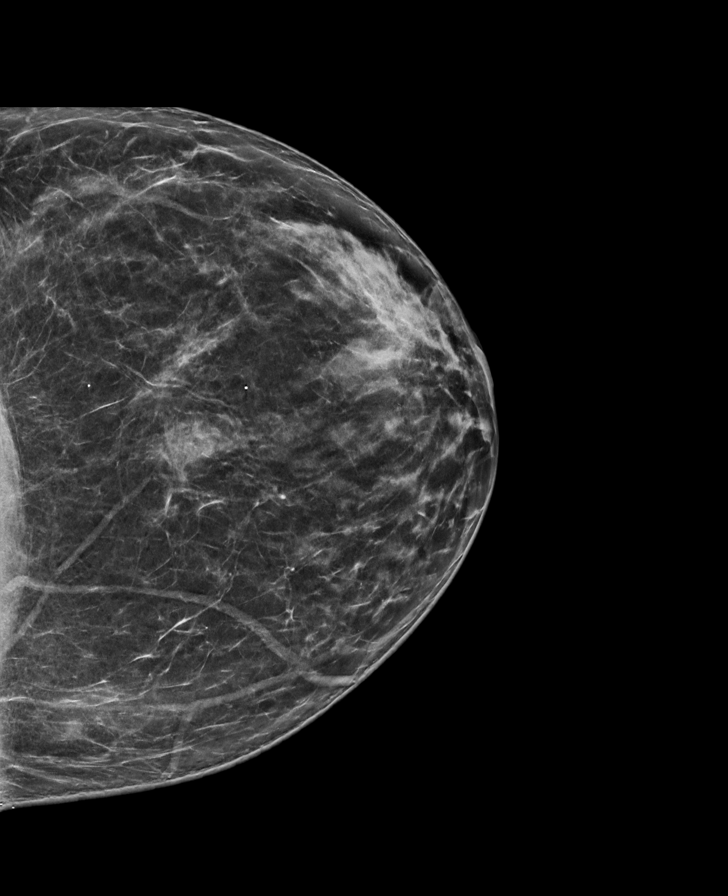

[R CC synth-2D]
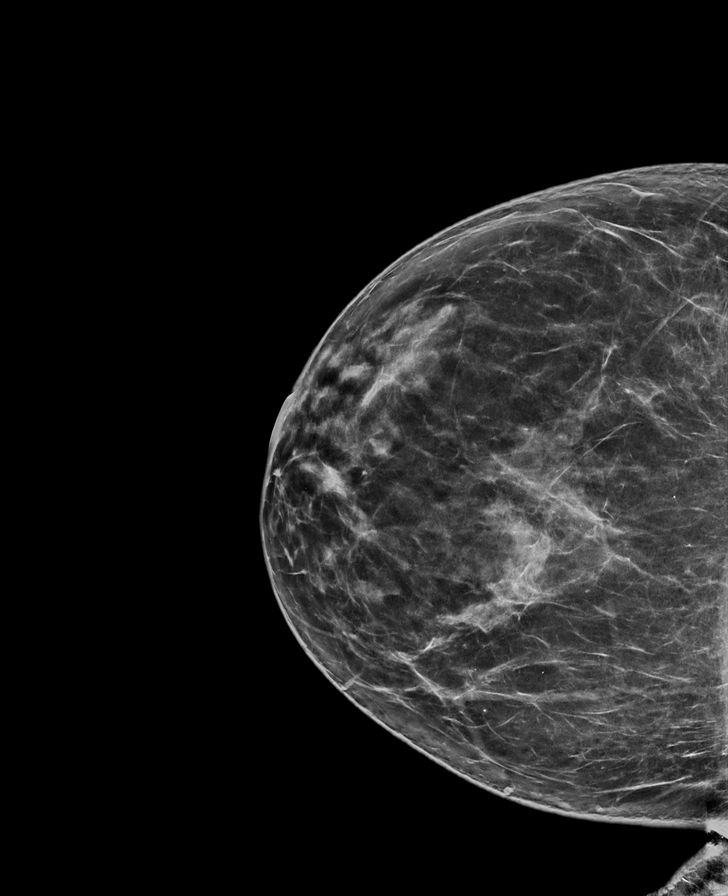

[L MLO tomo · tomo slice 48/95.0]
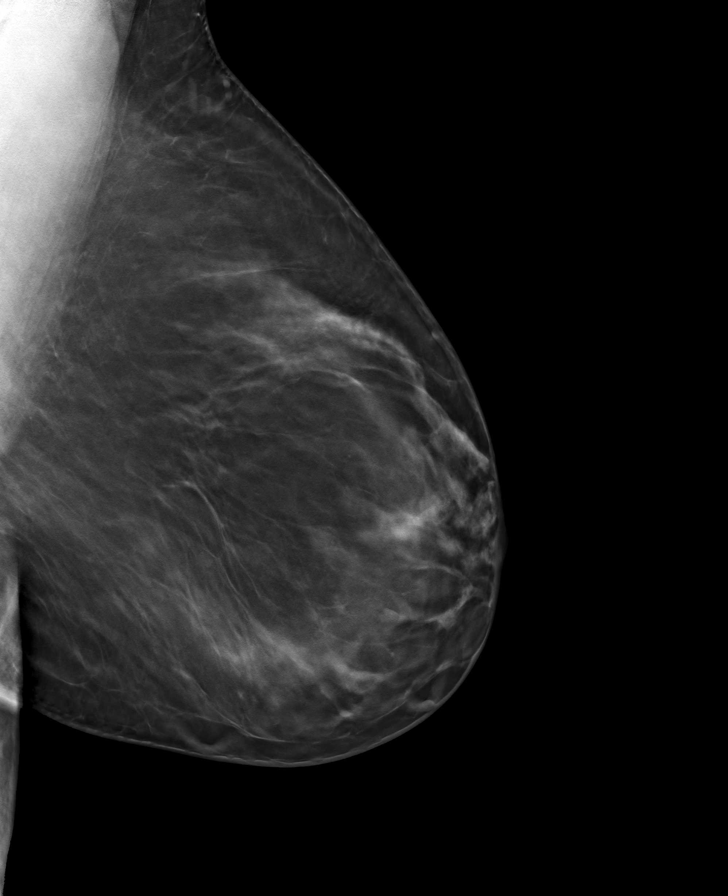

[L CC tomo · tomo slice 41/82.0]
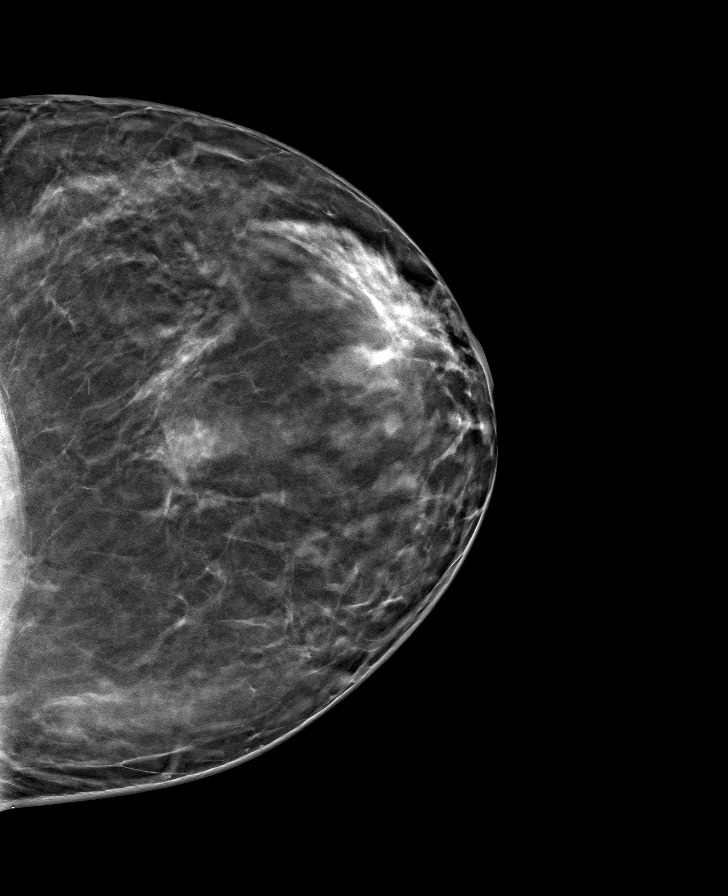

[R MLO tomo · tomo slice 45/89.0]
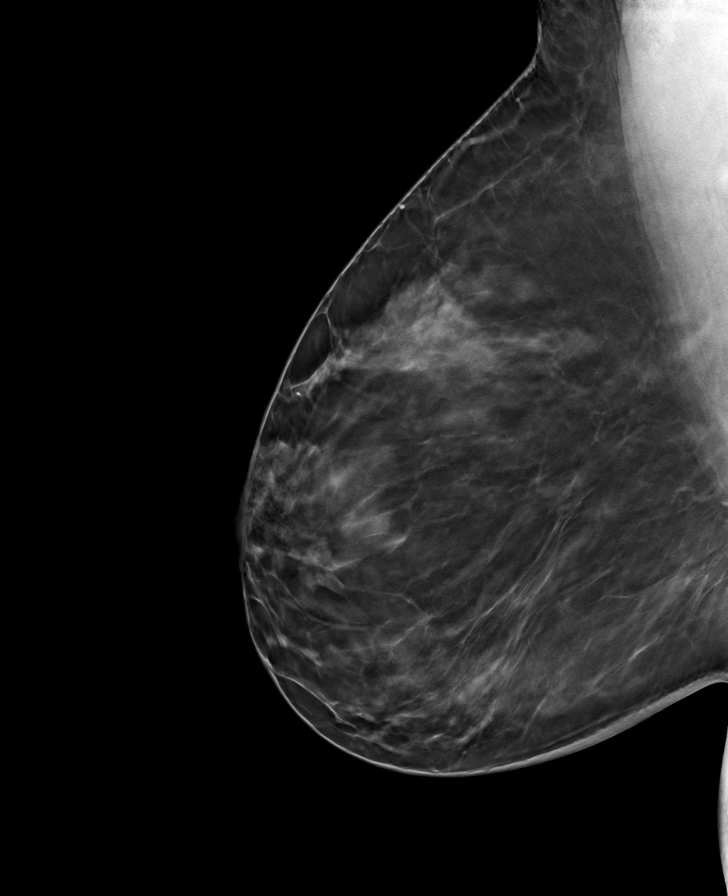

[R CC tomo · tomo slice 43/85.0]
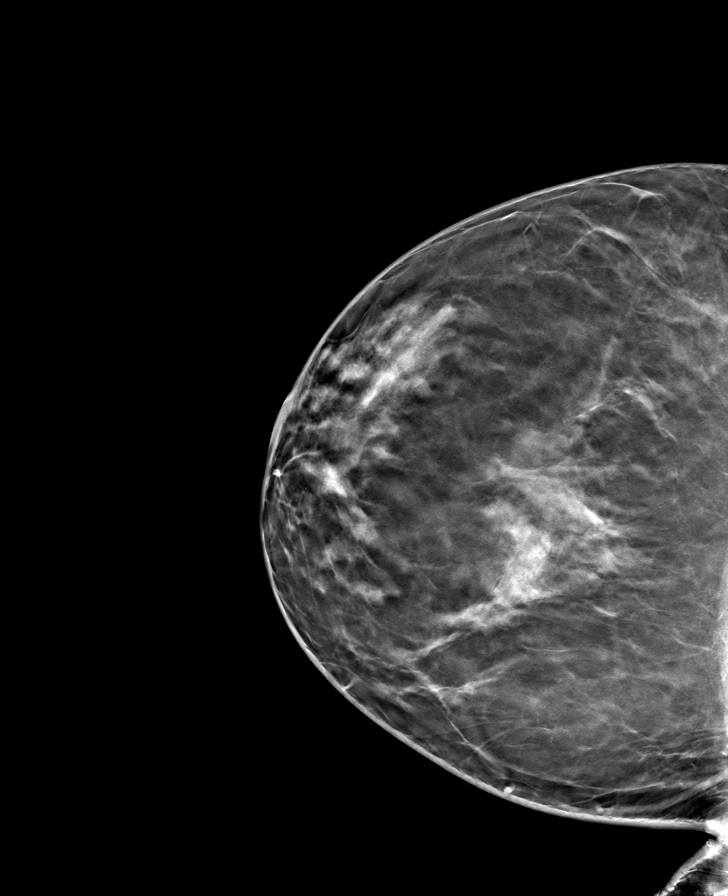

[8 of 24 positions shown; findings below may reference images not displayed]

ACR Breast Density Category c: The breast tissue is heterogeneously
dense, which may obscure small masses.
FINDINGS: There are no findings suspicious for malignancy. Images were
processed with CAD.
IMPRESSION: No mammographic evidence of malignancy. A result letter of this
screening mammogram will be mailed directly to the patient.

RECOMMENDATION:
Screening mammogram in one year. (Code:FT-[id number])

BI-RADS CATEGORY  1: Negative.

## 2019-08-11 DIAGNOSIS — F419 Anxiety disorder, unspecified: Secondary | ICD-10-CM | POA: Diagnosis not present

## 2019-08-11 DIAGNOSIS — Z86718 Personal history of other venous thrombosis and embolism: Secondary | ICD-10-CM | POA: Diagnosis not present

## 2019-08-11 DIAGNOSIS — R002 Palpitations: Secondary | ICD-10-CM | POA: Diagnosis not present

## 2019-08-11 DIAGNOSIS — Z0000 Encounter for general adult medical examination without abnormal findings: Secondary | ICD-10-CM | POA: Diagnosis not present

## 2019-08-11 DIAGNOSIS — Z13220 Encounter for screening for lipoid disorders: Secondary | ICD-10-CM | POA: Diagnosis not present

## 2019-08-11 DIAGNOSIS — I10 Essential (primary) hypertension: Secondary | ICD-10-CM | POA: Diagnosis not present

## 2019-08-11 DIAGNOSIS — Z131 Encounter for screening for diabetes mellitus: Secondary | ICD-10-CM | POA: Diagnosis not present

## 2019-08-11 DIAGNOSIS — D6859 Other primary thrombophilia: Secondary | ICD-10-CM | POA: Diagnosis not present

## 2019-08-11 DIAGNOSIS — E049 Nontoxic goiter, unspecified: Secondary | ICD-10-CM | POA: Diagnosis not present

## 2019-08-12 ENCOUNTER — Other Ambulatory Visit: Payer: Self-pay | Admitting: Family Medicine

## 2019-08-12 DIAGNOSIS — E049 Nontoxic goiter, unspecified: Secondary | ICD-10-CM

## 2019-08-13 ENCOUNTER — Other Ambulatory Visit (HOSPITAL_COMMUNITY): Payer: Self-pay | Admitting: Family Medicine

## 2019-08-13 DIAGNOSIS — R011 Cardiac murmur, unspecified: Secondary | ICD-10-CM

## 2019-08-18 DIAGNOSIS — J209 Acute bronchitis, unspecified: Secondary | ICD-10-CM | POA: Diagnosis not present

## 2019-08-18 DIAGNOSIS — B349 Viral infection, unspecified: Secondary | ICD-10-CM | POA: Diagnosis not present

## 2019-08-19 DIAGNOSIS — B349 Viral infection, unspecified: Secondary | ICD-10-CM | POA: Diagnosis not present

## 2019-08-31 ENCOUNTER — Emergency Department (HOSPITAL_BASED_OUTPATIENT_CLINIC_OR_DEPARTMENT_OTHER)
Admission: EM | Admit: 2019-08-31 | Discharge: 2019-08-31 | Disposition: A | Discharge status: Home / Self Care | Payer: BC Managed Care – PPO | Attending: Emergency Medicine | Admitting: Emergency Medicine

## 2019-08-31 ENCOUNTER — Emergency Department (HOSPITAL_BASED_OUTPATIENT_CLINIC_OR_DEPARTMENT_OTHER): Payer: BC Managed Care – PPO

## 2019-08-31 ENCOUNTER — Encounter (HOSPITAL_BASED_OUTPATIENT_CLINIC_OR_DEPARTMENT_OTHER): Payer: Self-pay | Admitting: Emergency Medicine

## 2019-08-31 DIAGNOSIS — I82409 Acute embolism and thrombosis of unspecified deep veins of unspecified lower extremity: Secondary | ICD-10-CM | POA: Insufficient documentation

## 2019-08-31 DIAGNOSIS — Y929 Unspecified place or not applicable: Secondary | ICD-10-CM | POA: Insufficient documentation

## 2019-08-31 DIAGNOSIS — S79921A Unspecified injury of right thigh, initial encounter: Secondary | ICD-10-CM | POA: Diagnosis not present

## 2019-08-31 DIAGNOSIS — X58XXXA Exposure to other specified factors, initial encounter: Secondary | ICD-10-CM | POA: Diagnosis not present

## 2019-08-31 DIAGNOSIS — S7011XA Contusion of right thigh, initial encounter: Secondary | ICD-10-CM | POA: Diagnosis not present

## 2019-08-31 DIAGNOSIS — M79651 Pain in right thigh: Secondary | ICD-10-CM | POA: Insufficient documentation

## 2019-08-31 DIAGNOSIS — Y999 Unspecified external cause status: Secondary | ICD-10-CM | POA: Insufficient documentation

## 2019-08-31 DIAGNOSIS — Y939 Activity, unspecified: Secondary | ICD-10-CM | POA: Diagnosis not present

## 2019-08-31 DIAGNOSIS — Z7982 Long term (current) use of aspirin: Secondary | ICD-10-CM | POA: Diagnosis not present

## 2019-08-31 DIAGNOSIS — Z79899 Other long term (current) drug therapy: Secondary | ICD-10-CM | POA: Insufficient documentation

## 2019-08-31 NOTE — ED Triage Notes (Signed)
Pt c/o pain to right thigh onset yesterday. Pt denies recent injury.

## 2019-08-31 NOTE — Discharge Instructions (Addendum)
Your ultrasound today is reassuring, there is no evidence of DVT in your leg today. Follow-up with your doctor as needed.

## 2019-08-31 NOTE — ED Notes (Signed)
Presents with left posterior leg pain, states has bruise at back of leg. Denies any chest pain, sob. States she has a hx of 2 DVT and has been on PO blood thinners. Has recently been on a short plane trip. Has strong plantar and dorsal flexion of Rt Lower Extremity, 2 plus Rt pedal and Rt post tib pulse with good capillary refill. ED PA at bedside and performed visual assessment of stated bruise area posterior of Rt knee

## 2019-08-31 NOTE — ED Provider Notes (Signed)
[name] Note   CSN: [id number] Arrival date & time: 08/31/19  1550     History Chief Complaint  Patient presents with  . Leg Pain    Samantha Moon is a 49 y.o. female.  49 year old female presents with complaint of a bruise to her posterior right thigh which she first noticed today.  Patient states that she has had 2 DVTs in the past, was seen by hematology, diagnosed with a protein deficiency, treated with 6 months of blood thinner and was not required to continue on this medication.  Patient states that she did recently return from a short flight to [place].  Denies chest pain or shortness of breath.  Denies injury to the leg, swelling.  No other complaints or concerns.        Past Medical History:  Diagnosis Date  . Chest pain    DR Marlou Porch  . DVT (deep venous thrombosis) ([place])   . Hx gestational diabetes     Patient Active Problem List   Diagnosis Date Noted  . Folliculitis 94/80/1655  . History of gestational diabetes 05/11/2011    Past Surgical History:  Procedure Laterality Date  . HERNIA REPAIR  1991  . TUBAL LIGATION  2000   BTL     OB History    Gravida  3   Para  2   Term      Preterm      AB  1   Living  2     SAB  1   TAB      Ectopic      Multiple      Live Births              Family History  Problem Relation Age of Onset  . Hypertension Mother   . Stroke Mother   . Hypertension Father   . Hypertension Brother   . Breast cancer Paternal Aunt     Social History   Tobacco Use  . Smoking status: Never Smoker  . Smokeless tobacco: Never Used  Vaping Use  . Vaping Use: Never used  Substance Use Topics  . Alcohol use: Yes    Comment: occasional  . Drug use: No    Home Medications Prior to Admission medications   Medication Sig Start Date End Date Taking? Authorizing Provider  amLODipine-benazepril (LOTREL) 5-10 MG capsule Take 1 capsule by mouth daily.    [provider]  aspirin EC 81 MG tablet Take 81 mg by mouth daily.    [provider]  cetirizine (ZYRTEC) 10 MG tablet Take 10 mg by mouth daily. Reported on 05/05/2015    [provider]  clonazePAM (KLONOPIN) 0.5 MG tablet Take 0.5 mg by mouth 2 (two) times daily as needed for anxiety. Reported on 05/05/2015    [provider]  Ferrous Sulfate (IRON PO) Take by mouth.    [provider]    Allergies    Nitrofurantoin monohyd macro and Sudafed [pseudoephedrine hcl]  Review of Systems   Review of Systems  Constitutional: Negative for fever.  Respiratory: Negative for shortness of breath.   Cardiovascular: Negative for chest pain and leg swelling.  Musculoskeletal: Negative for arthralgias and myalgias.  Skin: Positive for color change. Negative for wound.  Allergic/Immunologic: Negative for immunocompromised state.  Neurological: Negative for weakness and numbness.  Hematological: Does not bruise/bleed easily.  All other systems reviewed and are negative.   Physical Exam Updated Vital Signs BP Marland Kitchen)  157/94 (BP Location: Left Arm)   Pulse 76   Temp 99.2 F (37.3 C) (Oral)   Resp 18   Ht 5' 7.5" (1.715 m)   Wt 98 kg   LMP 08/31/2019   SpO2 97%   BMI 33.33 kg/m   Physical Exam Vitals and nursing note reviewed.  Constitutional:      General: She is not in acute distress.    Appearance: She is well-developed. She is not diaphoretic.  HENT:     Head: Normocephalic and atraumatic.  Cardiovascular:     Pulses: Normal pulses.  Pulmonary:     Effort: Pulmonary effort is normal.  Musculoskeletal:        General: No swelling or tenderness. Normal range of motion.     Right lower leg: No edema.     Left lower leg: No edema.       Legs:     Comments: Negative Homans.  Contusion noted to posterior medial right thigh.  Skin:    General: Skin is warm and dry.     Findings: Bruising present. No erythema or rash.  Neurological:     Mental  Status: She is alert and oriented to person, place, and time.  Psychiatric:        Behavior: Behavior normal.     ED Results / Procedures / Treatments   Labs (all labs ordered are listed, but only abnormal results are displayed) Labs Reviewed - No data to display  EKG None  Radiology US Venous Img Lower Right (DVT Study)  Result Date: 08/31/2019 CLINICAL DATA:  Right medial thigh bruise EXAM: RIGHT LOWER EXTREMITY VENOUS DOPPLER ULTRASOUND TECHNIQUE: Gray-scale sonography with graded compression, as well as color Doppler and duplex ultrasound were performed to evaluate the lower extremity deep venous systems from the level of the common femoral vein and including the common femoral, femoral, profunda femoral, popliteal and calf veins including the posterior tibial, peroneal and gastrocnemius veins when visible. The superficial great saphenous vein was also interrogated. Spectral Doppler was utilized to evaluate flow at rest and with distal augmentation maneuvers in the common femoral, femoral and popliteal veins. COMPARISON:  03/21/2017 FINDINGS: Contralateral Common Femoral Vein: Respiratory phasicity is normal and symmetric with the symptomatic side. No evidence of thrombus. Normal compressibility. Common Femoral Vein: No evidence of thrombus. Normal compressibility, respiratory phasicity and response to augmentation. Saphenofemoral Junction: No evidence of thrombus. Normal compressibility and flow on color Doppler imaging. Profunda Femoral Vein: No evidence of thrombus. Normal compressibility and flow on color Doppler imaging. Femoral Vein: No evidence of thrombus. Normal compressibility, respiratory phasicity and response to augmentation. Popliteal Vein: No evidence of thrombus. Normal compressibility, respiratory phasicity and response to augmentation. Calf Veins: No evidence of thrombus. Normal compressibility and flow on color Doppler imaging. Superficial Great Saphenous Vein: No evidence of  thrombus. Normal compressibility. Venous Reflux:  None. Other Findings:  None. IMPRESSION: No evidence of right lower extremity deep venous thrombosis. Electronically Signed   By: Davina Poke D.O.   On: 08/31/2019 17:55    Procedures Procedures (including critical care time)  Medications Ordered in ED Medications - No data to display  ED Course  I have reviewed the triage vital signs and the nursing notes.  Pertinent labs & imaging results that were available during my care of the patient were reviewed by me and considered in my medical decision making (see chart for details).  Clinical Course as of Aug 30 1799  Nancy Fetter Aug 30, 2880  8531 49 year old  female with contusion to the leg with concern for DVT, history of 2 DVTs in the past secondary to protein deficiency, not anticoagulated currently.  No respiratory complaints. Offered reassurance that bruising alone does not indicate DVT however due to her history and concern with recent travel, will ultrasound the extremity for DVT study.   [LM]  1801 Ultrasound is negative for DVT, recommend follow-up with PCP as needed.   [LM]    Clinical Course User Index [LM] Roque Lias   MDM Rules/Calculators/A&P                          Final Clinical Impression(s) / ED Diagnoses Final diagnoses:  Contusion of right thigh, initial encounter    Rx / DC Orders ED Discharge Orders    None       Tacy Learn, PA-C 08/31/19 1802    Lucrezia Starch, MD 09/04/19 [phone]

## 2019-09-04 ENCOUNTER — Ambulatory Visit
Admission: RE | Admit: 2019-09-04 | Discharge: 2019-09-04 | Disposition: A | Discharge status: Home / Self Care | Payer: BC Managed Care – PPO | Source: Ambulatory Visit | Attending: Family Medicine | Admitting: Family Medicine

## 2019-09-04 ENCOUNTER — Other Ambulatory Visit: Payer: Self-pay

## 2019-09-04 DIAGNOSIS — E049 Nontoxic goiter, unspecified: Secondary | ICD-10-CM

## 2019-09-04 DIAGNOSIS — E079 Disorder of thyroid, unspecified: Secondary | ICD-10-CM | POA: Diagnosis not present

## 2019-09-05 ENCOUNTER — Ambulatory Visit (HOSPITAL_COMMUNITY): Payer: BC Managed Care – PPO | Attending: Cardiology

## 2019-09-05 DIAGNOSIS — R011 Cardiac murmur, unspecified: Secondary | ICD-10-CM | POA: Diagnosis not present

## 2019-09-05 LAB — ECHOCARDIOGRAM COMPLETE
Area-P 1/2: 2.37 cm2
S' Lateral: 2.7 cm

## 2019-09-12 ENCOUNTER — Encounter: Payer: Self-pay | Admitting: Obstetrics and Gynecology

## 2019-09-12 ENCOUNTER — Ambulatory Visit (INDEPENDENT_AMBULATORY_CARE_PROVIDER_SITE_OTHER): Payer: BC Managed Care – PPO | Admitting: Obstetrics and Gynecology

## 2019-09-12 ENCOUNTER — Other Ambulatory Visit: Payer: Self-pay

## 2019-09-12 VITALS — BP 136/84

## 2019-09-12 DIAGNOSIS — N939 Abnormal uterine and vaginal bleeding, unspecified: Secondary | ICD-10-CM

## 2019-09-12 DIAGNOSIS — N926 Irregular menstruation, unspecified: Secondary | ICD-10-CM

## 2019-09-12 MED ORDER — MEDROXYPROGESTERONE ACETATE 10 MG PO TABS: 10.0000 mg | ORAL_TABLET | Freq: Every day | ORAL | 0 refills | Status: DC

## 2019-09-12 NOTE — Progress Notes (Signed)
Samantha Moon Jul 16, 1970 [id number]  SUBJECTIVE:  49 y.o. [id number] female presents for heavy periods.  She had history of regular monthly menses through end of 2019 and then started having irregular bleeding.  She had a sonohysterogram 01/2018 showing several small leiomyoma less than 3 cm in size and a 2.6 cm simple right ovarian cyst.  Endometrial biopsy was benign and showed polyp tissue.  Pap smear 04/2015 normal with negative HPV.  Normal TSH 11/2017, FSH 12.5 at that time.  She had several months of normal periods since her last work-up through June, and then her period came early on 7/2, then she had return of menstrual bleeding again 7/8-7/15, and then it stopped and she resumed another period on 7/28 with fairly continuous bleeding since then.  She gets intermittent gushes of heavier bleeding followed by lighter flow in addition to cramping.  Prior DVT history noted.  Reports history of tubal ligation.   Current Outpatient Medications  Medication Sig Dispense Refill  . amLODipine-benazepril (LOTREL) 5-10 MG capsule Take 1 capsule by mouth daily.    Marland Kitchen aspirin EC 81 MG tablet Take 81 mg by mouth daily.    . cetirizine (ZYRTEC) 10 MG tablet Take 10 mg by mouth daily. Reported on 05/05/2015    . clonazePAM (KLONOPIN) 0.5 MG tablet Take 0.5 mg by mouth 2 (two) times daily as needed for anxiety. Reported on 05/05/2015    . Ferrous Sulfate (IRON PO) Take by mouth. (Patient not taking: Reported on 09/12/2019)     No current facility-administered medications for this visit.   Allergies: Nitrofurantoin monohyd macro and Sudafed [pseudoephedrine hcl]  Patient's last menstrual period was 08/21/2019.  Past medical history,surgical history, problem list, medications, allergies, family history and social history were all reviewed and documented as reviewed in the EPIC chart.  ROS:  Feeling well. No dyspnea or chest pain on exertion.  No abdominal pain, change in bowel habits, black or bloody stools.  No  urinary tract symptoms. GYN ROS: Abnormal bleeding pattern as above   OBJECTIVE:  BP 136/84 (Cuff Size: Large)   LMP 08/21/2019  The patient appears well, alert, oriented x 3, in no distress. PELVIC EXAM: Deferred to next visit  Chaperone: Caryn Bee present during the examination  ASSESSMENT:  49 y.o. [id number] here for perimenopausal abnormal uterine bleeding  PLAN:  Likely experiencing perimenopausal hormonal transition and the associated menstrual bleeding irregularities.  Still possible for there to be endometrial polyps since that kind of tissue was detected on her last endometrial biopsy over a year and a half ago.  She has known small fibroids and we will reevaluate those on sonohysterogram/ultrasound next week.  Still up-to-date on her Pap smear.  Will recommend checking TSH, CBC, FSH at next visit. With her history of DVT, she is not a candidate for combined hormonal contraceptives for control of the abnormal uterine bleeding. We discussed the levonorgestrel IUD as a viable option and some of the potential side effects from systemic absorption of the hormone, but this option would be safe even with her history of prior DVT with minimal increased risk of thrombosis. For the short-term we will try a course of Provera 10 mg daily to help slow the current bleeding episode. We discussed endometrial ablation and hysterectomy briefly.  With a prior history of tubal ligation she would be at risk for post ablation pain and need for hysterectomy in the future. She will do more research on her own about the IUD and may consider  that. We will have her back in the next week or so for SHGM/ultrasound, possible endometrial biopsy, and lab work.   Joseph Pierini MD 09/12/19

## 2019-09-18 DIAGNOSIS — Z20822 Contact with and (suspected) exposure to covid-19: Secondary | ICD-10-CM | POA: Diagnosis not present

## 2019-09-19 ENCOUNTER — Other Ambulatory Visit: Payer: Self-pay | Admitting: Obstetrics and Gynecology

## 2019-09-25 ENCOUNTER — Encounter: Payer: Self-pay | Admitting: Obstetrics and Gynecology

## 2019-09-25 ENCOUNTER — Ambulatory Visit (INDEPENDENT_AMBULATORY_CARE_PROVIDER_SITE_OTHER): Payer: BC Managed Care – PPO

## 2019-09-25 ENCOUNTER — Ambulatory Visit (INDEPENDENT_AMBULATORY_CARE_PROVIDER_SITE_OTHER): Payer: BC Managed Care – PPO | Admitting: Obstetrics and Gynecology

## 2019-09-25 ENCOUNTER — Other Ambulatory Visit: Payer: Self-pay | Admitting: Obstetrics and Gynecology

## 2019-09-25 ENCOUNTER — Other Ambulatory Visit: Payer: Self-pay

## 2019-09-25 DIAGNOSIS — N840 Polyp of corpus uteri: Secondary | ICD-10-CM

## 2019-09-25 DIAGNOSIS — N854 Malposition of uterus: Secondary | ICD-10-CM

## 2019-09-25 DIAGNOSIS — R9389 Abnormal findings on diagnostic imaging of other specified body structures: Secondary | ICD-10-CM | POA: Diagnosis not present

## 2019-09-25 DIAGNOSIS — N939 Abnormal uterine and vaginal bleeding, unspecified: Secondary | ICD-10-CM

## 2019-09-25 DIAGNOSIS — D251 Intramural leiomyoma of uterus: Secondary | ICD-10-CM

## 2019-09-25 DIAGNOSIS — N926 Irregular menstruation, unspecified: Secondary | ICD-10-CM

## 2019-09-25 NOTE — Progress Notes (Signed)
Samantha Moon 11-21-1970 [id number]  SUBJECTIVE:  49 y.o. [id number] female presents for a sonohysterogram due to abnormal uterine bleeding.  Please see the previous note from 09/12/2019 for details.  She did take the Provera as prescribed over a 10-day course and has noted better control of her irregular menstrual bleeding at this point.  Current Outpatient Medications  Medication Sig Dispense Refill  . amLODipine-benazepril (LOTREL) 5-10 MG capsule Take 1 capsule by mouth daily.    Marland Kitchen aspirin EC 81 MG tablet Take 81 mg by mouth daily.    . cetirizine (ZYRTEC) 10 MG tablet Take 10 mg by mouth daily. Reported on 05/05/2015    . clonazePAM (KLONOPIN) 0.5 MG tablet Take 0.5 mg by mouth 2 (two) times daily as needed for anxiety. Reported on 05/05/2015    . Ferrous Sulfate (IRON PO) Take by mouth. (Patient not taking: Reported on 09/12/2019)    . medroxyPROGESTERone (PROVERA) 10 MG tablet Take 1 tablet (10 mg total) by mouth daily. 10 tablet 0   No current facility-administered medications for this visit.   Allergies: Nitrofurantoin monohyd macro and Sudafed [pseudoephedrine hcl]  Patient's last menstrual period was 08/31/2019.  Past medical history,surgical history, problem list, medications, allergies, family history and social history were all reviewed and documented as reviewed in the EPIC chart.   OBJECTIVE:  LMP 08/31/2019  The patient appears well, alert, oriented, in no distress.  PELVIC EXAM: VULVA: normal appearing vulva with no masses, tenderness or lesions, VAGINA: normal appearing vagina with normal color and discharge, no lesions, CERVIX: normal appearing cervix without discharge or lesions, UTERUS: uterus is mildly enlarged to 10 weeks, shape, consistency and nontender, ADNEXA: normal adnexa in size, nontender and no masses  Chaperone: G. Whitley RDMS present during the examination and procedure  Pelvic ultrasound and sonohysterogram Anteverted enlarged uterus 9.8 x 6.6 x 6.8  cm Several intramural fibroids noted, largest measuring 3.0 x 2.7 cm Endometrium 11.7 mm thickness Bilateral ovaries with normal follicle pattern No adnexal masses.  No free fluid.  Sonohysterogram procedure note The cervix was visualized with an open sided speculum.  An Allis clamp was attached to the anterior lip of the cervix.  The cervix was cleansed with a Betadine swab x2.  Cervix was dilated with a cervical os finder.  Maintaining sterile technique, the SHGM catheter was gently inserted until fundal resistance was met at about 10 cm depth.  The transvaginal ultrasound probe was inserted by the sonographer.  The endometrial cavity was infused with approximately 10 mL of 0.9% normal saline.  Real-time images were captured on ultrasound indicating a 1 cm polyp present in the lower uterine segment.  The catheter then fell out of the cervix and no additional polyps were visualized.  Procedure was concluded at this time and the patient had tolerated it well without complication.  ASSESSMENT:  49 y.o. [id number] here for abnormal uterine bleeding, previous endometrial ablation, finding of endometrial polyp  PLAN:  We discussed the finding of the endometrial polyp.  I would recommend proceeding to hysteroscopy D&C with polypectomy to help her with improvement of the abnormal uterine bleeding symptoms on a longer-term basis.  To ensure continued improvement of her menstrual bleeding pattern we could also consider insertion of a Mirena IUD at the time of that procedure which she would like to think further about.  Alternatively we could keep her on cyclic versus continuous Provera but this would cause more systemic hormone exposure with the possible adverse effects.  She does  have a personal history of DVT in the past.  I will leave her some information in the MyChart AVS for her to review and we will bring her back in for another appointment to further discuss next steps in management on 10/03/2019.  All  questions were answered by the end of today's visit.   Joseph Pierini MD 09/25/19

## 2019-10-03 ENCOUNTER — Other Ambulatory Visit: Payer: Self-pay

## 2019-10-03 ENCOUNTER — Encounter: Payer: Self-pay | Admitting: Obstetrics and Gynecology

## 2019-10-03 ENCOUNTER — Ambulatory Visit (INDEPENDENT_AMBULATORY_CARE_PROVIDER_SITE_OTHER): Payer: BC Managed Care – PPO | Admitting: Obstetrics and Gynecology

## 2019-10-03 VITALS — BP 126/80

## 2019-10-03 DIAGNOSIS — N939 Abnormal uterine and vaginal bleeding, unspecified: Secondary | ICD-10-CM | POA: Diagnosis not present

## 2019-10-03 DIAGNOSIS — R9389 Abnormal findings on diagnostic imaging of other specified body structures: Secondary | ICD-10-CM

## 2019-10-03 DIAGNOSIS — N926 Irregular menstruation, unspecified: Secondary | ICD-10-CM | POA: Diagnosis not present

## 2019-10-03 DIAGNOSIS — N840 Polyp of corpus uteri: Secondary | ICD-10-CM

## 2019-10-03 LAB — CBC
HCT: 33.2 % — ABNORMAL LOW (ref 35.0–45.0)
Hemoglobin: 11 g/dL — ABNORMAL LOW (ref 11.7–15.5)
MCH: 30.6 pg (ref 27.0–33.0)
MCHC: 33.1 g/dL (ref 32.0–36.0)
MCV: 92.5 fL (ref 80.0–100.0)
MPV: 9.4 fL (ref 7.5–12.5)
Platelets: 399 10*3/uL (ref 140–400)
RBC: 3.59 10*6/uL — ABNORMAL LOW (ref 3.80–5.10)
RDW: 12.6 % (ref 11.0–15.0)
WBC: 4.5 10*3/uL (ref 3.8–10.8)

## 2019-10-03 LAB — FOLLICLE STIMULATING HORMONE: FSH: 3 m[IU]/mL

## 2019-10-03 LAB — TSH: TSH: 0.91 mIU/L

## 2019-10-03 MED ORDER — MEDROXYPROGESTERONE ACETATE 10 MG PO TABS: 10.0000 mg | ORAL_TABLET | Freq: Every day | ORAL | 0 refills | Status: DC

## 2019-10-03 NOTE — Progress Notes (Signed)
Samantha Moon 10/07/1970 [id number]  SUBJECTIVE:  49 y.o. [id number] female presents for preoperative discussion of hysteroscopy, D&C, polypectomy, Mirena IUD placement.  Current Outpatient Medications  Medication Sig Dispense Refill  . amLODipine-benazepril (LOTREL) 5-10 MG capsule Take 1 capsule by mouth daily.    Marland Kitchen aspirin EC 81 MG tablet Take 81 mg by mouth daily.    . cetirizine (ZYRTEC) 10 MG tablet Take 10 mg by mouth daily. Reported on 05/05/2015    . clonazePAM (KLONOPIN) 0.5 MG tablet Take 0.5 mg by mouth 2 (two) times daily as needed for anxiety. Reported on 05/05/2015    . Ferrous Sulfate (IRON PO) Take by mouth.     . medroxyPROGESTERone (PROVERA) 10 MG tablet Take 1 tablet (10 mg total) by mouth daily. 10 tablet 0   No current facility-administered medications for this visit.   Allergies: Nitrofurantoin monohyd macro and Sudafed [pseudoephedrine hcl]  Patient's last menstrual period was 09/28/2019.  Past medical history,surgical history, problem list, medications, allergies, family history and social history were all reviewed and documented as reviewed in the EPIC chart.  ROS: Pertinent positives and negatives as reviewed in HPI  OBJECTIVE:  BP 126/80 (BP Location: Right Arm, Patient Position: Sitting, Cuff Size: Large)   LMP 09/28/2019  The patient appears well, alert, oriented, in no distress. Pelvic exam was performed at last visit   ASSESSMENT:  49 y.o. [name] here for preoperative discussion of hysteroscopy, D&C, polypectomy, Mirena IUD placement  PLAN:   I recommend performing hysteroscopy, dilation and curettage to evaluate for any intrauterine pathology and to perform polypectomy with Mirena IUD placement.  Would be best to schedule when she is at the end of her period or at least in the early part of her next cycle.  I will also have her check FSH, TSH, CBC today.  I discussed the same-day outpatient intent of the procedure, there are risks of infection,  bleeding, possible need for blood transfusion, perforation of uterus and/or cervix resulting in injury to surrounding organ structures including major pelvic blood vessels, bowel, bladder, and potentially ureter, and DVT.  Anesthesia will either be general or MAC depending on the anesthesiologist's choice, and inherent risks with being placed under anesthesia include myocardial infarction, stroke, rarely death.  Very rarely would laparoscopy and/or laparotomy be indicated to tend to any intra-abdominal hemorrhage and/or injury concern.   Postoperative recovery expectations of needing a few days away from employment duties possible.  After the [facility] her bleeding will probably be better for a little while but if she does not do any additional long-term management she will likely have return of heavy cycles.  Keeping in mind her prior history of DVT, for longer-term control of the bleeding we discussed the option of continuous cyclic Provera or to place Mirena IUD at the time of the procedure.  She is interested in the IUD.  Endometrial ablation would be an option at the risk of post ablation pain syndrome with her prior tubal ligation.  I counseled her on the Mirena IUD including in the initial period of time after insertion to expect cramping and/or abnormal bleeding, especially in the first 3-6 months use.  After this time period the period can get very light or some women become amenorrheic.  Potential hormonal side effects discussed.  Insertion risks include infection, uterine perforation and device migration into the abdomen which would require surgical removal, or the device can become dislodged causing pain or fall out.  All questions are answered and  the patient agrees to proceed.  I will have our staff contact her to arrange the procedure.   Joseph Pierini MD 10/03/19

## 2019-10-07 ENCOUNTER — Telehealth: Payer: Self-pay

## 2019-10-07 NOTE — Telephone Encounter (Signed)
Spoke with patient and informed her. She declined the endometrial ablation.

## 2019-10-07 NOTE — Telephone Encounter (Signed)
I called patient to schedule surgery.  We discussed her insurance benefits.  Her insurance will not cover any birth control devices. Coverage is excluded on her plan even when diagnosis is for menorrhagia. Patient wants to proceed with D&C Hyst and see how bleeding does after that and if it continues to be a problem she will decide then how to proceed.  I scheduled her for 10/28/19 at 7:30am at [facility].   We discussed her estimated surgery prepymt due by one week prior of surgery to [place]. Financial letter will be mailed.  I discussed with her need for Covid testing 4 days prior to surgery and an appointment was scheduled accordingly. I reviewed location and quarantine protocol.  Handouts w map will be mailed to her.

## 2019-10-07 NOTE — Telephone Encounter (Signed)
Okay.  She can just monitor the bleeding pattern after the fluoroscopy D&C, however she could still also consider endometrial ablation, but as we discussed with prior tubal ligation there is a low risk she might develop some post ablation pain syndrome down the road.  Just making sure she is aware of that option as well.

## 2019-10-08 ENCOUNTER — Encounter: Payer: Self-pay | Admitting: Gynecology

## 2019-10-09 DIAGNOSIS — Z6833 Body mass index (BMI) 33.0-33.9, adult: Secondary | ICD-10-CM | POA: Diagnosis not present

## 2019-10-09 DIAGNOSIS — R7303 Prediabetes: Secondary | ICD-10-CM | POA: Diagnosis not present

## 2019-10-09 DIAGNOSIS — E041 Nontoxic single thyroid nodule: Secondary | ICD-10-CM | POA: Diagnosis not present

## 2019-10-10 ENCOUNTER — Other Ambulatory Visit: Payer: Self-pay | Admitting: Obstetrics and Gynecology

## 2019-10-17 DIAGNOSIS — Z0289 Encounter for other administrative examinations: Secondary | ICD-10-CM

## 2019-10-21 ENCOUNTER — Telehealth: Payer: Self-pay | Admitting: *Deleted

## 2019-10-21 ENCOUNTER — Encounter: Payer: Self-pay | Admitting: Gynecology

## 2019-10-21 NOTE — Telephone Encounter (Signed)
Patient is scheduled for D&C on 10/28/19 called asking if she can have her "pain medication" sent to pharmacy before surgery. I explained typically you prescribe medication (if any) same day as surgery. Patient said okay, she then asked if you would prescribe a stronger pain medication. Patient said you told her about a pain relieve OTC and she is worried that the OTC may not be strong enough. Please advise

## 2019-10-21 NOTE — Telephone Encounter (Signed)
Patient informed. 

## 2019-10-21 NOTE — Telephone Encounter (Signed)
Will talk day of surgery, I typically ask if pts think they will need stronger pain medicine after procedures and then prescribe accordingly

## 2019-10-22 ENCOUNTER — Other Ambulatory Visit: Payer: Self-pay

## 2019-10-22 ENCOUNTER — Encounter (HOSPITAL_BASED_OUTPATIENT_CLINIC_OR_DEPARTMENT_OTHER): Payer: Self-pay | Admitting: Obstetrics and Gynecology

## 2019-10-22 NOTE — Progress Notes (Signed)
Spoke w/ via phone for pre-op interview---pt Lab needs dos----    I stat 8, urine poct           Lab results------ekg - dr Drema [place] 7- 19-2021 on chart echo 09-05-2019 epic COVID test ------10-24-19 1440 Arrive at -------530am 10-28-2019 NPO after MN No solid Food.  Clear liquids from MN until---430 am then npo Medications to take morning of surgery -----clonazepoam prn, zyrtec Diabetic medication -----n/a Patient Special Instructions -----pt to call dr Delilah Shan about stopping 81 mg aspirin Pre-Op special Istructions ----- Patient verbalized understanding of instructions that were given at this phone interview. Patient denies shortness of breath, chest pain, fever, cough at this phone interview.

## 2019-10-24 ENCOUNTER — Other Ambulatory Visit (HOSPITAL_COMMUNITY)
Admission: RE | Admit: 2019-10-24 | Discharge: 2019-10-24 | Disposition: A | Discharge status: Home / Self Care | Payer: BC Managed Care – PPO | Source: Ambulatory Visit | Attending: Obstetrics and Gynecology | Admitting: Obstetrics and Gynecology

## 2019-10-24 DIAGNOSIS — Z01812 Encounter for preprocedural laboratory examination: Secondary | ICD-10-CM | POA: Diagnosis not present

## 2019-10-24 DIAGNOSIS — Z20822 Contact with and (suspected) exposure to covid-19: Secondary | ICD-10-CM | POA: Insufficient documentation

## 2019-10-24 LAB — SARS CORONAVIRUS 2 (TAT 6-24 HRS): SARS Coronavirus 2: NEGATIVE

## 2019-10-27 ENCOUNTER — Encounter (HOSPITAL_BASED_OUTPATIENT_CLINIC_OR_DEPARTMENT_OTHER): Payer: Self-pay | Admitting: Obstetrics and Gynecology

## 2019-10-27 NOTE — Anesthesia Preprocedure Evaluation (Addendum)
Anesthesia Evaluation  Patient identified by MRN, date of birth, ID band Patient awake    Reviewed: Allergy & Precautions, NPO status , Patient's Chart, lab work & pertinent test results  Airway Mallampati: II  TM Distance: >3 FB Neck ROM: Full    Dental no notable dental hx.    Pulmonary neg pulmonary ROS,    Pulmonary exam normal breath sounds clear to auscultation       Cardiovascular Exercise Tolerance: Good hypertension, Normal cardiovascular exam Rhythm:Regular Rate:Normal  09/05/19  1. Left ventricular ejection fraction, by estimation, is 60 to 65%. The  left ventricle has normal function. The left ventricle has no regional  wall motion abnormalities. Left ventricular diastolic parameters are  consistent with Grade I diastolic  dysfunction (impaired relaxation).  2. Right ventricular systolic function is normal. The right ventricular  size is normal. There is normal pulmonary artery systolic pressure. The  estimated right ventricular systolic pressure is 24.0 mmHg.  3. The mitral valve is normal in structure. Trivial mitral valve  regurgitation. No evidence of mitral stenosis.  4. The aortic valve is tricuspid. Aortic valve regurgitation is not  visualized. No aortic stenosis is present.  5. The inferior vena cava is normal in size with greater than 50%  respiratory variability, suggesting right atrial pressure of 3 mmHg.   Neuro/Psych negative neurological ROS  negative psych ROS   GI/Hepatic negative GI ROS, Neg liver ROS,   Endo/Other  BMI 32  Renal/GU negative Renal ROS  negative genitourinary   Musculoskeletal   Abdominal   Peds negative pediatric ROS (+)  Hematology  (+) anemia ,   Anesthesia Other Findings   Reproductive/Obstetrics                            Anesthesia Physical Anesthesia Plan  ASA: II  Anesthesia Plan: MAC   Post-op Pain Management:     Induction: Intravenous  PONV Risk Score and Plan: 2 and Propofol infusion, TIVA, Treatment may vary due to age or medical condition, Midazolam and Ondansetron  Airway Management Planned: Natural Airway and Simple Face Mask  Additional Equipment:   Intra-op Plan:   Post-operative Plan:   Informed Consent: I have reviewed the patients History and Physical, chart, labs and discussed the procedure including the risks, benefits and alternatives for the proposed anesthesia with the patient or authorized representative who has indicated his/her understanding and acceptance.       Plan Discussed with: CRNA and Anesthesiologist  Anesthesia Plan Comments: (Propofol gtt + natural airway. GA/LMA backup plan pending surgical needs.)       Anesthesia Quick Evaluation

## 2019-10-28 ENCOUNTER — Ambulatory Visit (HOSPITAL_BASED_OUTPATIENT_CLINIC_OR_DEPARTMENT_OTHER): Payer: BC Managed Care – PPO | Admitting: Anesthesiology

## 2019-10-28 ENCOUNTER — Other Ambulatory Visit: Payer: Self-pay

## 2019-10-28 ENCOUNTER — Encounter (HOSPITAL_BASED_OUTPATIENT_CLINIC_OR_DEPARTMENT_OTHER)
Admission: RE | Disposition: A | Discharge status: Home / Self Care | Payer: Self-pay | Source: Home / Self Care | Attending: Obstetrics and Gynecology

## 2019-10-28 ENCOUNTER — Ambulatory Visit (HOSPITAL_BASED_OUTPATIENT_CLINIC_OR_DEPARTMENT_OTHER)
Admission: RE | Admit: 2019-10-28 | Discharge: 2019-10-28 | Disposition: A | Discharge status: Home / Self Care | Payer: BC Managed Care – PPO | Attending: Obstetrics and Gynecology | Admitting: Obstetrics and Gynecology

## 2019-10-28 ENCOUNTER — Encounter (HOSPITAL_BASED_OUTPATIENT_CLINIC_OR_DEPARTMENT_OTHER): Payer: Self-pay | Admitting: Obstetrics and Gynecology

## 2019-10-28 DIAGNOSIS — Z881 Allergy status to other antibiotic agents status: Secondary | ICD-10-CM | POA: Insufficient documentation

## 2019-10-28 DIAGNOSIS — Z9851 Tubal ligation status: Secondary | ICD-10-CM | POA: Insufficient documentation

## 2019-10-28 DIAGNOSIS — Z86718 Personal history of other venous thrombosis and embolism: Secondary | ICD-10-CM | POA: Insufficient documentation

## 2019-10-28 DIAGNOSIS — N939 Abnormal uterine and vaginal bleeding, unspecified: Secondary | ICD-10-CM | POA: Diagnosis not present

## 2019-10-28 DIAGNOSIS — I10 Essential (primary) hypertension: Secondary | ICD-10-CM | POA: Diagnosis not present

## 2019-10-28 DIAGNOSIS — Z79899 Other long term (current) drug therapy: Secondary | ICD-10-CM | POA: Diagnosis not present

## 2019-10-28 DIAGNOSIS — N920 Excessive and frequent menstruation with regular cycle: Secondary | ICD-10-CM | POA: Diagnosis present

## 2019-10-28 DIAGNOSIS — Z888 Allergy status to other drugs, medicaments and biological substances status: Secondary | ICD-10-CM | POA: Insufficient documentation

## 2019-10-28 DIAGNOSIS — N840 Polyp of corpus uteri: Secondary | ICD-10-CM | POA: Insufficient documentation

## 2019-10-28 DIAGNOSIS — D649 Anemia, unspecified: Secondary | ICD-10-CM | POA: Insufficient documentation

## 2019-10-28 DIAGNOSIS — Z7982 Long term (current) use of aspirin: Secondary | ICD-10-CM | POA: Insufficient documentation

## 2019-10-28 HISTORY — PX: DILATATION & CURETTAGE/HYSTEROSCOPY WITH MYOSURE: SHX6511

## 2019-10-28 HISTORY — DX: Cardiac murmur, unspecified: R01.1

## 2019-10-28 HISTORY — DX: Abnormal uterine and vaginal bleeding, unspecified: N93.9

## 2019-10-28 HISTORY — DX: Anemia, unspecified: D64.9

## 2019-10-28 HISTORY — DX: Essential (primary) hypertension: I10

## 2019-10-28 LAB — POCT I-STAT, CHEM 8
BUN: 17 mg/dL (ref 6–20)
Calcium, Ion: 1.25 mmol/L (ref 1.15–1.40)
Chloride: 104 mmol/L (ref 98–111)
Creatinine, Ser: 1 mg/dL (ref 0.44–1.00)
Glucose, Bld: 98 mg/dL (ref 70–99)
HCT: 35 % — ABNORMAL LOW (ref 36.0–46.0)
Hemoglobin: 11.9 g/dL — ABNORMAL LOW (ref 12.0–15.0)
Potassium: 3.9 mmol/L (ref 3.5–5.1)
Sodium: 142 mmol/L (ref 135–145)
TCO2: 24 mmol/L (ref 22–32)

## 2019-10-28 LAB — POCT PREGNANCY, URINE: Preg Test, Ur: NEGATIVE

## 2019-10-28 SURGERY — DILATATION & CURETTAGE/HYSTEROSCOPY WITH MYOSURE
Anesthesia: Monitor Anesthesia Care | Site: Vagina

## 2019-10-28 MED ORDER — BUPIVACAINE HCL (PF) 0.25 % IJ SOLN
INTRAMUSCULAR | Status: DC | PRN
  Administered 2019-10-28: 10 mL

## 2019-10-28 MED ORDER — LEVONORGESTREL 20 MCG/24HR IU IUD: INTRAUTERINE_SYSTEM | INTRAUTERINE | Status: DC

## 2019-10-28 MED ORDER — PROPOFOL 500 MG/50ML IV EMUL
INTRAVENOUS | Status: AC
  Filled 2019-10-28: qty 50

## 2019-10-28 MED ORDER — POVIDONE-IODINE 10 % EX SWAB: 2.0000 "application " | Freq: Once | CUTANEOUS | Status: DC

## 2019-10-28 MED ORDER — FENTANYL CITRATE (PF) 100 MCG/2ML IJ SOLN
INTRAMUSCULAR | Status: AC
  Filled 2019-10-28: qty 2

## 2019-10-28 MED ORDER — FENTANYL CITRATE (PF) 100 MCG/2ML IJ SOLN
INTRAMUSCULAR | Status: DC | PRN
Start: 2019-10-28 — End: 2019-10-28
  Administered 2019-10-28 (×2): 50 ug via INTRAVENOUS

## 2019-10-28 MED ORDER — ACETAMINOPHEN 500 MG PO TABS: 1000.0000 mg | ORAL_TABLET | Freq: Four times a day (QID) | ORAL | Status: AC | PRN

## 2019-10-28 MED ORDER — LACTATED RINGERS IV SOLN: INTRAVENOUS | Status: DC

## 2019-10-28 MED ORDER — ENOXAPARIN SODIUM 40 MG/0.4ML ~~LOC~~ SOLN
SUBCUTANEOUS | Status: AC
  Filled 2019-10-28: qty 0.4

## 2019-10-28 MED ORDER — SODIUM CHLORIDE 0.9 % IR SOLN
Status: DC | PRN
  Administered 2019-10-28: 900 mL

## 2019-10-28 MED ORDER — HYDROMORPHONE HCL 1 MG/ML IJ SOLN
INTRAMUSCULAR | Status: AC
  Filled 2019-10-28: qty 1

## 2019-10-28 MED ORDER — KETOROLAC TROMETHAMINE 30 MG/ML IJ SOLN
INTRAMUSCULAR | Status: AC
  Filled 2019-10-28: qty 1

## 2019-10-28 MED ORDER — ONDANSETRON HCL 4 MG/2ML IJ SOLN
INTRAMUSCULAR | Status: AC
  Filled 2019-10-28: qty 2

## 2019-10-28 MED ORDER — IBUPROFEN 600 MG PO TABS: 600.0000 mg | ORAL_TABLET | Freq: Four times a day (QID) | ORAL | 1 refills | Status: AC

## 2019-10-28 MED ORDER — HYDROCODONE-ACETAMINOPHEN 5-325 MG PO TABS
1.0000 | ORAL_TABLET | Freq: Four times a day (QID) | ORAL | 0 refills | Status: DC | PRN
Start: 2019-10-28 — End: 2020-02-23

## 2019-10-28 MED ORDER — SODIUM CHLORIDE 0.9% FLUSH: 3.0000 mL | Freq: Two times a day (BID) | INTRAVENOUS | Status: DC

## 2019-10-28 MED ORDER — LIDOCAINE 2% (20 MG/ML) 5 ML SYRINGE
INTRAMUSCULAR | Status: DC | PRN
  Administered 2019-10-28: 40 mg via INTRAVENOUS

## 2019-10-28 MED ORDER — KETAMINE HCL 10 MG/ML IJ SOLN
INTRAMUSCULAR | Status: DC | PRN
  Administered 2019-10-28: 30 mg via INTRAVENOUS

## 2019-10-28 MED ORDER — HYDROCODONE-ACETAMINOPHEN 7.5-325 MG PO TABS: 1.0000 | ORAL_TABLET | Freq: Once | ORAL | Status: DC | PRN

## 2019-10-28 MED ORDER — LIDOCAINE 2% (20 MG/ML) 5 ML SYRINGE
INTRAMUSCULAR | Status: AC
  Filled 2019-10-28: qty 5

## 2019-10-28 MED ORDER — PROPOFOL 500 MG/50ML IV EMUL
INTRAVENOUS | Status: DC | PRN
  Administered 2019-10-28: 125 ug/kg/min via INTRAVENOUS

## 2019-10-28 MED ORDER — KETAMINE HCL 10 MG/ML IJ SOLN
INTRAMUSCULAR | Status: AC
  Filled 2019-10-28: qty 1

## 2019-10-28 MED ORDER — HYDROMORPHONE HCL 1 MG/ML IJ SOLN
0.2500 mg | INTRAMUSCULAR | Status: DC | PRN
  Administered 2019-10-28: 0.25 mg via INTRAVENOUS

## 2019-10-28 MED ORDER — KETOROLAC TROMETHAMINE 30 MG/ML IJ SOLN
INTRAMUSCULAR | Status: DC | PRN
  Administered 2019-10-28: 30 mg via INTRAVENOUS

## 2019-10-28 MED ORDER — ENOXAPARIN SODIUM 40 MG/0.4ML ~~LOC~~ SOLN
40.0000 mg | Freq: Once | SUBCUTANEOUS | Status: AC
  Administered 2019-10-28: 40 mg via SUBCUTANEOUS

## 2019-10-28 MED ORDER — MIDAZOLAM HCL 2 MG/2ML IJ SOLN
INTRAMUSCULAR | Status: AC
  Filled 2019-10-28: qty 2

## 2019-10-28 MED ORDER — ONDANSETRON HCL 4 MG/2ML IJ SOLN
INTRAMUSCULAR | Status: DC | PRN
  Administered 2019-10-28: 4 mg via INTRAVENOUS

## 2019-10-28 MED ORDER — PROMETHAZINE HCL 25 MG/ML IJ SOLN: 6.2500 mg | INTRAMUSCULAR | Status: DC | PRN

## 2019-10-28 MED ORDER — MIDAZOLAM HCL 5 MG/5ML IJ SOLN
INTRAMUSCULAR | Status: DC | PRN
  Administered 2019-10-28: 2 mg via INTRAVENOUS

## 2019-10-28 SURGICAL SUPPLY — 26 items
CATH ROBINSON RED A/P 16FR (CATHETERS) ×2 IMPLANT
COVER WAND RF STERILE (DRAPES) ×2 IMPLANT
DEVICE MYOSURE LITE (MISCELLANEOUS) IMPLANT
DEVICE MYOSURE REACH (MISCELLANEOUS) IMPLANT
DILATOR CANAL MILEX (MISCELLANEOUS) IMPLANT
ELECT REM PT RETURN 9FT ADLT (ELECTROSURGICAL)
ELECTRODE REM PT RTRN 9FT ADLT (ELECTROSURGICAL) IMPLANT
GAUZE 4X4 16PLY RFD (DISPOSABLE) ×2 IMPLANT
GLOVE BIO SURGEON STRL SZ8 (GLOVE) ×2 IMPLANT
GLOVE BIOGEL PI IND STRL 6.5 (GLOVE) IMPLANT
GLOVE BIOGEL PI IND STRL 7.0 (GLOVE) IMPLANT
GLOVE BIOGEL PI IND STRL 8 (GLOVE) ×2 IMPLANT
GLOVE BIOGEL PI INDICATOR 6.5 (GLOVE) ×1
GLOVE BIOGEL PI INDICATOR 7.0 (GLOVE) ×1
GLOVE BIOGEL PI INDICATOR 8 (GLOVE) ×2
GOWN STRL REUS W/TWL LRG LVL3 (GOWN DISPOSABLE) ×1 IMPLANT
GOWN STRL REUS W/TWL XL LVL3 (GOWN DISPOSABLE) ×2 IMPLANT
IV NS IRRIG 3000ML ARTHROMATIC (IV SOLUTION) ×3 IMPLANT
KIT PROCEDURE FLUENT (KITS) ×2 IMPLANT
MYOSURE XL FIBROID (MISCELLANEOUS)
PACK VAGINAL MINOR WOMEN LF (CUSTOM PROCEDURE TRAY) ×2 IMPLANT
PAD OB MATERNITY 4.3X12.25 (PERSONAL CARE ITEMS) ×2 IMPLANT
PAD PREP 24X48 CUFFED NSTRL (MISCELLANEOUS) ×2 IMPLANT
SEAL CERVICAL OMNI LOK (ABLATOR) IMPLANT
SEAL ROD LENS SCOPE MYOSURE (ABLATOR) ×2 IMPLANT
SYSTEM TISS REMOVAL MYOSURE XL (MISCELLANEOUS) IMPLANT

## 2019-10-28 NOTE — Op Note (Signed)
Name: Samantha Moon  Age: 48 y.o.  Date of Birth: March 08, 1970 Medical Record #: [id number]   Operative Note   Preoperative Diagnosis: Abnormal uterine bleeding, suspected endometrial polyp Procedure: Hysteroscopy, Dilatation and Curettage Postoperative Diagnosis: same Surgeon: Joseph Pierini, MD Estimated Blood Loss: 5 mL Anesthesia: MAC, local with 0.25% bupivacaine (10 mL) Specimens: endometrial curettings with polyp fragments Findings: Normal endometrial cavity with bilateral tubal ostia visualized.  Normal cavity contour without submucosal fibroids, thickening of endometrium posteriorly.  Polyp like tissue removed with curettage.  Endocervical canal normal. Uterus sounds to 10 cm.  Hysteroscopic fluid deficit 80 mL.  Complication: none. Date: 10/28/19     DESCRIPTION OF PROCEDURE:      Preoperative review of the procedure was completed with the patient prior to transport to the operating room including risks, benefits, and alternatives.  The patient ultimately decided she did not want to proceed with levonorgestrel IUD placement today due to insurance coverage concerns.  The patient's questions were answered and she agreed to proceed.    Under anesthesia, Samantha Moon was placed in the dorsolithotomy position with legs in yellofin stirrups with SCDs applied and running.  A surgical team time out was performed to verify and agree on procedure and patient consent. A bimanual exam was performed.  The patient was prepped and draped in the usual sterile fashion.      Cervix was visualized with a weighted speculum placed in the posterior vagina and a Sims retractor anteriorly.  A paracervical block was applied in the standard fashion using 0.25% Marcaine. The anterior cervix was grasped with a single-toothed tenaculum. The uterine descensus was noted to be moderate.  Cervix was gently dilated using a progressive series of Pratt dilators to size 17.  There was no concern for uterine or cervical  perforation.  The uterine cavity was very gently sounded to establish a measurement of uterine cavity depth.  The Myosure hysteroscope was introduced and the uterine cavity and ostia were visualized.  The cervix was gently dilated a second time to size 19 to allow introduction of curettage instrument.  A thorough curettage was productive of moderate endometrial tissue and what appeared to be some polyp tissue. The curettage was effective at achieving a uterine cry circumferentially.  The hysteroscope was introduced into the endometrial cavity again and bleeding was minimal at that point.  The tenaculum was removed from the cervix and the puncture sites were hemostatic.   Sponge and instrument counts were correct at the conclusion of the procedure. The patient tolerated the procedure well and was brought to the recovery room in stable condition.  Plan will be for a single dose of enoxaparin 40 mg subcutaneously x 1 dose postoperatively.     Joseph Pierini, M.D., [name]

## 2019-10-28 NOTE — Anesthesia Postprocedure Evaluation (Signed)
Anesthesia Post Note  Patient: Samantha Moon  Procedure(s) Performed: DILATATION & CURETTAGE/HYSTEROSCOPY (N/A Vagina )     Patient location during evaluation: PACU Anesthesia Type: MAC Level of consciousness: awake and alert Pain management: pain level controlled Vital Signs Assessment: post-procedure vital signs reviewed and stable Respiratory status: spontaneous breathing and respiratory function stable Cardiovascular status: stable Postop Assessment: no apparent nausea or vomiting Anesthetic complications: no   No complications documented.  Last Vitals:  Vitals:   10/28/19 0845 10/28/19 0917  BP: (!) 147/81 (!) 147/77  Pulse: (!) 57 (!) 56  Resp: 20 16  Temp: (!) 36.3 C 36.6 C  SpO2: 98% 100%    Last Pain:  Vitals:   10/28/19 0845  TempSrc:   PainSc: [place]

## 2019-10-28 NOTE — Transfer of Care (Signed)
Immediate Anesthesia Transfer of Care Note  Patient: Samantha Moon  Procedure(s) Performed: DILATATION & CURETTAGE/HYSTEROSCOPY (N/A Vagina )  Patient Location: PACU  Anesthesia Type:MAC  Level of Consciousness: awake, alert  and oriented  Airway & Oxygen Therapy: Patient Spontanous Breathing and Patient connected to face mask oxygen  Post-op Assessment: Report given to RN and Post -op Vital signs reviewed and stable  Post vital signs: Reviewed and stable  Last Vitals:  Vitals Value Taken Time  BP 127/72   Temp    Pulse 66 10/28/19 0810  Resp 22 10/28/19 0810  SpO2 100 % 10/28/19 0810  Vitals shown include unvalidated device data.  Last Pain:  Vitals:   10/28/19 0559  TempSrc: Oral  PainSc: 0-No pain         Complications: No complications documented.

## 2019-10-28 NOTE — H&P (Signed)
   Samantha Moon 1971-01-08 MRN: [id number]  HPI The patient is a 49 y.o. [id number] who presents today for scheduled hysteroscopy D&C with polypectomy for abnormal uterine bleeding and suspected endometrial polyp.  She has decided not to have the IUD placement today because she was told insurance would not cover placement while in the OR setting.  No changes to her medical history since her pre op exam, denies CP, SOB, fever/chills, dysuria.  Past Medical History:  Diagnosis Date  . Abnormal uterine bleeding (AUB)   . Anemia   . DVT (deep venous thrombosis) ([place]) 2019   x 2 one in each leg at different times  . Heart murmur    echo done 09-05-2019  . Hx gestational diabetes   . Hypertension     Past Surgical History:  Procedure Laterality Date  . HERNIA REPAIR  1991   inguinal  . TUBAL LIGATION  2000   BTL   Allergies  Allergen Reactions  . Nitrofurantoin Monohyd Macro     MIGRAINES  . Sudafed [Pseudoephedrine Hcl] Other (See Comments)    delirious   No current facility-administered medications on file prior to encounter.   Current Outpatient Medications on File Prior to Encounter  Medication Sig Dispense Refill  . amLODipine-benazepril (LOTREL) 5-10 MG capsule Take 1 capsule by mouth daily.    Marland Kitchen aspirin EC 81 MG tablet Take 81 mg by mouth daily.    . cetirizine (ZYRTEC) 10 MG tablet Take 10 mg by mouth daily. Reported on 05/05/2015    . clonazePAM (KLONOPIN) 0.5 MG tablet Take 0.5 mg by mouth 2 (two) times daily as needed for anxiety. Reported on 05/05/2015    . Ferrous Sulfate (IRON PO) Take by mouth.         Physical Exam   BP (!) 157/96   Pulse 63   Temp 98.3 F (36.8 C) (Oral)   Resp 18   Ht 5' 7.5" (1.715 m)   Wt 96.6 kg   LMP 09/30/2019   SpO2 100%   BMI 32.87 kg/m    General: Pleasant female, no acute distress, alert and oriented CV: RRR, no murmurs Pulm: good respiratory effort, CTAB     Plan Proceed with hysteroscopy D&C with polypectomy as planned.   All questions answered and the patient agrees to proceed.   Joseph Pierini, MD 10/28/19

## 2019-10-28 NOTE — Discharge Instructions (Signed)
DISCHARGE INSTRUCTIONS: D&C / D&E The following instructions have been prepared to help you care for yourself upon your return home.   Personal hygiene:  Use sanitary pads for vaginal drainage, not tampons.  Shower the day after your procedure.  NO tub baths, pools or Jacuzzis for 2-3 weeks.  Wipe front to back after using the bathroom.  Activity and limitations:  Do NOT drive or operate any equipment for 24 hours. The effects of anesthesia are still present and drowsiness may result.  Do NOT rest in bed all day.  Walking is encouraged.  Walk up and down stairs slowly.  You may resume your normal activity in one to two days or as indicated by your physician.  Sexual activity: NO intercourse for at least 2 weeks after the procedure, or as indicated by your physician.  Diet: Eat a light meal as desired this evening. You may resume your usual diet tomorrow.  Return to work: You may resume your work activities in one to two days or as indicated by your doctor.  What to expect after your surgery: Expect to have vaginal bleeding/discharge for 2-3 days and spotting for up to 10 days. It is not unusual to have soreness for up to 1-2 weeks. You may have a slight burning sensation when you urinate for the first day. Mild cramps may continue for a couple of days. You may have a regular period in 2-6 weeks.  Call your doctor for any of the following:  Excessive vaginal bleeding, saturating and changing one pad every hour.  Inability to urinate 6 hours after discharge from hospital.  Pain not relieved by pain medication.  Fever of 100.4 F or greater.  Unusual vaginal discharge or odor.    Post Anesthesia Home Care Instructions  Activity: Get plenty of rest for the remainder of the day. A responsible individual must stay with you for 24 hours following the procedure.  For the next 24 hours, DO NOT: -Drive a car -Operate machinery -Drink alcoholic beverages -Take any medication unless  instructed by your physician -Make any legal decisions or sign important papers.  Meals: Start with liquid foods such as gelatin or soup. Progress to regular foods as tolerated. Avoid greasy, spicy, heavy foods. If nausea and/or vomiting occur, drink only clear liquids until the nausea and/or vomiting subsides. Call your physician if vomiting continues.  Special Instructions/Symptoms: Your throat may feel dry or sore from the anesthesia or the breathing tube placed in your throat during surgery. If this causes discomfort, gargle with warm salt water. The discomfort should disappear within 24 hours.  

## 2019-10-29 ENCOUNTER — Encounter (HOSPITAL_BASED_OUTPATIENT_CLINIC_OR_DEPARTMENT_OTHER): Payer: Self-pay | Admitting: Obstetrics and Gynecology

## 2019-10-29 LAB — SURGICAL PATHOLOGY

## 2019-11-05 ENCOUNTER — Other Ambulatory Visit: Payer: Self-pay | Admitting: Obstetrics and Gynecology

## 2019-11-13 ENCOUNTER — Ambulatory Visit (INDEPENDENT_AMBULATORY_CARE_PROVIDER_SITE_OTHER): Payer: BC Managed Care – PPO | Admitting: Obstetrics and Gynecology

## 2019-11-13 ENCOUNTER — Encounter: Payer: Self-pay | Admitting: Obstetrics and Gynecology

## 2019-11-13 ENCOUNTER — Other Ambulatory Visit: Payer: Self-pay

## 2019-11-13 VITALS — BP 130/82

## 2019-11-13 DIAGNOSIS — Z9889 Other specified postprocedural states: Secondary | ICD-10-CM

## 2019-11-13 NOTE — Progress Notes (Signed)
   Samantha Moon 09/06/1970 [id number]   SUBJECTIVE   REASON FOR APPOINTMENT Chief Complaint  Patient presents with  . Post op check     HISTORY OF PRESENT ILLNESS DAO MEARNS presents for routine 2 week post-operative follow up after a hysteroscopy, dilation and curettage performed on 10/28/2019 for abnormal uterine bleeding and suspected endometrial polyp.  The patient is doing well with no concerns.  She denies vaginal bleeding or discharge. She is tolerating normal diet.  Bowel and bladder function are normal.  OBJECTIVE  BP 130/82   LMP  (LMP Unknown)    PHYSICAL EXAM General:  Patient in no acute distress.   ASSESSMENT / PLAN  Routine 2 week post operative check.   The patient is doing well and meeting all postoperative milestones.  I reviewed the benign pathology report describing the benign endometrial polyp tissue.  I showed her some pictures taken during the hysteroscopy.  She may return to normal activities without restriction and resume normal gynecologic care at this point.  Joseph Pierini MD 11/13/19

## 2020-02-16 ENCOUNTER — Telehealth: Payer: Self-pay

## 2020-02-16 NOTE — Telephone Encounter (Signed)
Had D&C, Hyst on 10/28/19.  In November had a normal period. In Dec was 3 weeks late but normal.  Jan menses started 02/01/20 and she is still bleeding. Bleeding like a period daily. What to rec?

## 2020-02-17 ENCOUNTER — Other Ambulatory Visit: Payer: Self-pay

## 2020-02-17 MED ORDER — MEDROXYPROGESTERONE ACETATE 10 MG PO TABS: 10.0000 mg | ORAL_TABLET | Freq: Every day | ORAL | 0 refills | Status: DC

## 2020-02-17 NOTE — Telephone Encounter (Signed)
Spoke with patient and informed her. Rx sent. 

## 2020-02-17 NOTE — Telephone Encounter (Signed)
Provera 10 mg daily x 10 days is what I would offer If bleeding continues, then she will need to be seen If any heavy bleeding, will need emergent evaluation

## 2020-02-23 ENCOUNTER — Telehealth: Payer: Self-pay

## 2020-02-23 ENCOUNTER — Other Ambulatory Visit: Payer: Self-pay

## 2020-02-23 ENCOUNTER — Ambulatory Visit (INDEPENDENT_AMBULATORY_CARE_PROVIDER_SITE_OTHER): Payer: BC Managed Care – PPO | Admitting: Nurse Practitioner

## 2020-02-23 ENCOUNTER — Encounter: Payer: Self-pay | Admitting: Nurse Practitioner

## 2020-02-23 VITALS — BP 128/80

## 2020-02-23 DIAGNOSIS — N939 Abnormal uterine and vaginal bleeding, unspecified: Secondary | ICD-10-CM | POA: Diagnosis not present

## 2020-02-23 LAB — CBC WITH DIFFERENTIAL/PLATELET
Absolute Monocytes: 366 cells/uL (ref 200–950)
Basophils Absolute: 43 cells/uL (ref 0–200)
Basophils Relative: 0.7 %
Eosinophils Absolute: 177 cells/uL (ref 15–500)
Eosinophils Relative: 2.9 %
HCT: 30.7 % — ABNORMAL LOW (ref 35.0–45.0)
Hemoglobin: 10.2 g/dL — ABNORMAL LOW (ref 11.7–15.5)
Lymphs Abs: 2898 cells/uL (ref 850–3900)
MCH: 29.9 pg (ref 27.0–33.0)
MCHC: 33.2 g/dL (ref 32.0–36.0)
MCV: 90 fL (ref 80.0–100.0)
MPV: 9.7 fL (ref 7.5–12.5)
Monocytes Relative: 6 %
Neutro Abs: 2617 cells/uL (ref 1500–7800)
Neutrophils Relative %: 42.9 %
Platelets: 377 10*3/uL (ref 140–400)
RBC: 3.41 10*6/uL — ABNORMAL LOW (ref 3.80–5.10)
RDW: 15.5 % — ABNORMAL HIGH (ref 11.0–15.0)
Total Lymphocyte: 47.5 %
WBC: 6.1 10*3/uL (ref 3.8–10.8)

## 2020-02-23 MED ORDER — MEGESTROL ACETATE 40 MG PO TABS: 40.0000 mg | ORAL_TABLET | Freq: Two times a day (BID) | ORAL | 1 refills | Status: DC

## 2020-02-23 NOTE — Telephone Encounter (Addendum)
Took Provera Tuesday night and Weds morning and Weds night.  Then stopped taking it on Thursday and Friday.  The bleeding got heavier after she stopped the Provera and now has chunks of blood coming out and saturating pad and tampon every hour to hour and half.  Took a Provera this morning. Dr. Scarlette Ar note from 02/16/20 "If bleeding continues, then she will need to be seen If any heavy bleeding, will need emergent evaluation."  I reminded patient Dr. Delilah Shan recommended this. Appt scheduled 02/23/20 at 2pm with [name], NP. Patient lives in [place] and cannot be here before that time.

## 2020-02-23 NOTE — Progress Notes (Signed)
   Acute Office Visit  Subjective:    Patient ID: Samantha Moon, female    DOB: 09/18/1970, 50 y.o.   MRN: [id number]   HPI 50 y.o. presents today for heavy bleeding since January 9th, 2022. History of menorrhagia with irregular cycles with hysteroscopy with D & C 11/16/2019 by Dr. Delilah Shan. Since procedure she had a normal cycle in November, late but normal cycle in December and then early cycle on 02/01/2020 that has remained heavy with clots that requires frequent changing of tampons and pads. Bleeding is accompanied by cramping. Most recent ultrasound 09/2019 showed multiple small myomas with largest measuring 3 x 2.7 cm. History of DVT. Was interested in [place] but insurance would not cover due to [place].    Review of Systems  Constitutional: Negative.   Gastrointestinal: Positive for abdominal pain (cramping).  Genitourinary: Positive for menstrual problem and vaginal bleeding.       Objective:    Physical Exam Constitutional:      Appearance: Normal appearance.  Abdominal:     Tenderness: There is no abdominal tenderness.  Genitourinary:    General: Normal vulva.     Vagina: Bleeding present.     Cervix: Normal.     Uterus: Normal.      BP 128/80 (BP Location: Right Arm, Patient Position: Sitting, Cuff Size: Large)   LMP 02/01/2020  Wt Readings from Last 3 Encounters:  10/28/19 213 lb (96.6 kg)  08/31/19 216 lb (98 kg)  01/02/19 215 lb (97.5 kg)        Assessment & Plan:   Problem List Items Addressed This Visit   None   Visit Diagnoses    Abnormal uterine bleeding (AUB)    -  Primary   Relevant Medications   megestrol (MEGACE) 40 MG tablet   Other Relevant Orders   CBC with Differential/Platelet     Plan: Prolonged bleeding most likely from anovulatory cycles/perimenopause. We will stop Provera and start Megace 40 mg twice daily x 10 days. We discussed options for management of bleeding with progestin-only methods such as IUD versus mini pill. She is still  interested in IUD but knows insurance will not cover - she was provided with out of pocket costs for insertion/device. She will let us know if she decides to move forward with either of these. We will check CBC today due to extended bleeding. Plan of care discussed with Dr. Talbert Nan (Dr. Delilah Shan out of office). Patient is agreeable to plan.      Samantha Moon [facility], 2:45 PM 02/23/2020

## 2020-02-24 ENCOUNTER — Other Ambulatory Visit: Payer: Self-pay | Admitting: Nurse Practitioner

## 2020-02-24 DIAGNOSIS — D509 Iron deficiency anemia, unspecified: Secondary | ICD-10-CM

## 2020-02-24 MED ORDER — FERROUS SULFATE 325 (65 FE) MG PO TBEC: 325.0000 mg | DELAYED_RELEASE_TABLET | Freq: Two times a day (BID) | ORAL | 0 refills | Status: DC

## 2020-03-05 MED ORDER — NORETHINDRONE 0.35 MG PO TABS: 1.0000 | ORAL_TABLET | Freq: Every day | ORAL | 0 refills | Status: DC

## 2020-03-05 NOTE — Telephone Encounter (Signed)
Samantha Moon patient called I read patient my chart message she would like to take the progestin only pill for now.

## 2020-03-05 NOTE — Telephone Encounter (Signed)
Great. Please send Micronor to her pharmacy with enough refills to get her to her next annual visit. She can start right away and it is recommended she take at the same time daily. Thank you!   Message text     Per Tiffany message Rx sent, annual exam due in April.

## 2020-03-09 ENCOUNTER — Telehealth: Payer: Self-pay

## 2020-03-09 NOTE — Telephone Encounter (Signed)
Left message to call. See telephone encounter.

## 2020-03-09 NOTE — Telephone Encounter (Signed)
My  Chart emails that [name], NP sent returned unread.  "Samantha Moon, After visiting with you I discussed your situation with Dr. Talbert Nan (Dr. Delilah Shan is not here today) and we both agree an ultrasound would not tell us much since you just had one back in September. Your bleeding is most likely from perimenopause which is causing cycles that do not have ovulation. When this happen hormones fluctuate causing the continuous bleeding. The best option for managing this is the IUD or the mini pill (progestin-only pill). If you decide you would like to move forward with either of these just reach out to our office to get this scheduled or sent to the pharmacy. Definitely recommend continuing with the Megace to help with the bleeding now. Reach out if you have any questions!"  "Also, I was just told the prices I gave you are the discounted prices, so that's after 57% was taken out. Sorry for the confusion!"  I called patient and told her she had unread emails in [facility] Chart and I wanted to read them to her and asked her to call me back. Left my direct phone number.

## 2020-03-10 NOTE — Telephone Encounter (Signed)
Patient called back in my voice mail to let me know she logged in to My Chart and read messages from [place].

## 2020-04-01 ENCOUNTER — Telehealth: Payer: Self-pay | Admitting: *Deleted

## 2020-04-01 NOTE — Telephone Encounter (Signed)
Patient called started on Micronor 0.35 mg tablet pill last month. Patient called today to report on 43/4/22 her cycle started. Patient said that evening she had horrible cramping she had to take OTC 800 mg tablet and OTC tylenol also with another pain strong pain medication patient did say the name. Report today the cramping is mild and only using motrin 800 mg to help with pain. The pain is pelvic, the pill have helping with bleeding, now having a "normal flow" not heavy anymore. She is concerned after reading the side effects that medication can cause ovarian cyst and maybe that's why she had that pain. Please advise

## 2020-04-01 NOTE — Telephone Encounter (Signed)
There is a slight increase for ovarian cysts (about 3%) for those using progesterone-only contraception, so it is possible the pain she is experiencing is cystic. Typically these cysts come and go and are not dangerous. If she continues to have pain like this with her cycles we need to consider the IUD. I know we discussed this previously and insurance would not cover but for management of bleeding and dysmenorrhea it may be our best option. I do recommend continuing to monitor for now and keep me updated if she is agreeable.

## 2020-04-02 NOTE — Telephone Encounter (Signed)
Patient informed with below note. 

## 2020-04-02 NOTE — Telephone Encounter (Signed)
Left message for patient to call.

## 2020-05-20 ENCOUNTER — Ambulatory Visit (INDEPENDENT_AMBULATORY_CARE_PROVIDER_SITE_OTHER): Payer: BC Managed Care – PPO | Admitting: Nurse Practitioner

## 2020-05-20 ENCOUNTER — Other Ambulatory Visit (HOSPITAL_COMMUNITY)
Admission: RE | Admit: 2020-05-20 | Discharge: 2020-05-20 | Disposition: A | Discharge status: Home / Self Care | Payer: BC Managed Care – PPO | Source: Ambulatory Visit | Attending: Nurse Practitioner | Admitting: Nurse Practitioner

## 2020-05-20 ENCOUNTER — Encounter: Payer: Self-pay | Admitting: Nurse Practitioner

## 2020-05-20 ENCOUNTER — Other Ambulatory Visit: Payer: Self-pay

## 2020-05-20 VITALS — BP 124/84 | Ht 68.0 in | Wt 208.0 lb

## 2020-05-20 DIAGNOSIS — Z01419 Encounter for gynecological examination (general) (routine) without abnormal findings: Secondary | ICD-10-CM

## 2020-05-20 DIAGNOSIS — D500 Iron deficiency anemia secondary to blood loss (chronic): Secondary | ICD-10-CM | POA: Diagnosis not present

## 2020-05-20 DIAGNOSIS — N921 Excessive and frequent menstruation with irregular cycle: Secondary | ICD-10-CM | POA: Diagnosis not present

## 2020-05-20 MED ORDER — NORETHINDRONE 0.35 MG PO TABS: 1.0000 | ORAL_TABLET | Freq: Every day | ORAL | 3 refills | Status: DC

## 2020-05-20 NOTE — Progress Notes (Signed)
Samantha Moon 08/21/1970 [id number]   History:  50 y.o. [id number] presents for annual exam with complains of frequent menses. Having cycles every 3 weeks that last 7 days. Was seen 01/2020 for AUB and started on Micronor (Mirena not covered by insurance due to [place]). She was also found to be anemic due to heavy menses and was started on PO iron. Ultrasound 09/2019 showed small myomas with largest measuring 3 x 2.7 cm. History of hysteroscopy with D & C 10/2019 due to menorrhagia with irregular cycles. Normal pap and mammogram history.  History of DVT.    Gynecologic History Patient's last menstrual period was 05/06/2020. Period Cycle (Days): 14 Period Duration (Days): 7 Period Pattern: Regular Menstrual Flow: Moderate Dysmenorrhea: None Contraception/Family planning: tubal ligation  Health Maintenance Last Pap: 05/05/2015. Results were: normal Last mammogram: 03/14/2018. Results were: normal Last colonoscopy: Never Last Dexa: Never  Past medical history, past surgical history, family history and social history were all reviewed and documented in the EPIC chart. 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   ROS:  A ROS was performed and pertinent positives and negatives are included.  Exam:  Vitals:   05/20/20 1108  BP: 124/84  Weight: 208 lb (94.3 kg)  Height: 5\' 8"  (1.727 m)   Body mass index is 31.63 kg/m.  General appearance:  Normal Thyroid:  Symmetrical, normal in size, without palpable masses or nodularity. Respiratory  Auscultation:  Clear without wheezing or rhonchi Cardiovascular  Auscultation:  Regular rate, without rubs, murmurs or gallops  Edema/varicosities:  Not grossly evident Abdominal  Soft,nontender, without masses, guarding or rebound.  Liver/spleen:  No organomegaly noted  Hernia:  None appreciated  Skin  Inspection:  Grossly normal Breasts: Examined lying and sitting.   Right: Without masses, retractions, nipple discharge or axillary  adenopathy.   Left: Without masses, retractions, nipple discharge or axillary adenopathy. Gentitourinary   Inguinal/mons:  Normal without inguinal adenopathy  External genitalia:  Normal appearing vulva with no masses, tenderness, or lesions  BUS/Urethra/Skene's glands:  Normal  Vagina:  Normal appearing with normal color and discharge, no lesions  Cervix:  Normal appearing without discharge or lesions  Uterus:  Normal in size, shape and contour.  Midline and mobile, nontender  Adnexa/parametria:     Rt: Normal in size, without masses or tenderness.   Lt: Normal in size, without masses or tenderness.  Anus and perineum: Normal  Digital rectal exam: Normal sphincter tone without palpated masses or tenderness  Assessment/Plan:  50 y.o. [id number] for annual exam.   Well female exam with routine gynecological exam - Plan: CBC with Differential/Platelet, Comprehensive metabolic panel, Cytology - PAP( [facility]). Education provided on SBEs, importance of preventative screenings, current guidelines, high calcium diet, regular exercise, and multivitamin daily.   Menorrhagia with irregular cycle - Plan: norethindrone (MICRONOR) 0.35 MG tablet daily. Since starting Micronor she is having cycles every 21 days that are heavy the first couple of days with total bleeding time of 7 days. History of anemia secondary to heavy menses. We discussed Mirena IUD again for management of bleeding and chronic anemia and she would like to proceed. She has tried Norethindrone x 3 months with no improvement in bleeding, it has actually increased in frequency. We will check coverage for Mirena IUD since treatments have failed.   Iron deficiency anemia due to chronic blood loss - Plan: CBC with Differential/Platelet. Hemoglobin 02/23/2020 was 10.2, she took PO iron supplements x 2 months. We will recheck today.  Screening for cervical cancer - Normal Pap history.  Pap with HR HPV today.   Screening for breast cancer -  Normal mammogram history.  She is overdue and encouraged to schedule soon.  Normal breast exam today.  Screening for colon cancer - Has not had screening colonoscopy. Discussed current guidelines and preventative screenings. Information provided on [facility].   Return in 1 year for annual.         Tamela Gammon DNP, 11:35 AM 05/20/2020

## 2020-05-20 NOTE — Patient Instructions (Addendum)
Schedule colonoscopy! [facility] [phone] [address] [place], [place] [address]   Health Maintenance, Female Adopting a healthy lifestyle and getting preventive care are important in promoting health and wellness. Ask your health care provider about:  The right schedule for you to have regular tests and exams.  Things you can do on your own to prevent diseases and keep yourself healthy. What should I know about diet, weight, and exercise? Eat a healthy diet  Eat a diet that includes plenty of vegetables, fruits, low-fat dairy products, and lean protein.  Do not eat a lot of foods that are high in solid fats, added sugars, or sodium.   Maintain a healthy weight Body mass index (BMI) is used to identify weight problems. It estimates body fat based on height and weight. Your health care provider can help determine your BMI and help you achieve or maintain a healthy weight. Get regular exercise Get regular exercise. This is one of the most important things you can do for your health. Most adults should:  Exercise for at least 150 minutes each week. The exercise should increase your heart rate and make you sweat (moderate-intensity exercise).  Do strengthening exercises at least twice a week. This is in addition to the moderate-intensity exercise.  Spend less time sitting. Even light physical activity can be beneficial. Watch cholesterol and blood lipids Have your blood tested for lipids and cholesterol at 50 years of age, then have this test every 5 years. Have your cholesterol levels checked more often if:  Your lipid or cholesterol levels are high.  You are older than 50 years of age.  You are at high risk for heart disease. What should I know about cancer screening? Depending on your health history and family history, you may need to have cancer screening at various ages. This may include screening for:  Breast cancer.  Cervical cancer.  Colorectal cancer.  Skin  cancer.  Lung cancer. What should I know about heart disease, diabetes, and high blood pressure? Blood pressure and heart disease  High blood pressure causes heart disease and increases the risk of stroke. This is more likely to develop in people who have high blood pressure readings, are of African descent, or are overweight.  Have your blood pressure checked: ? Every 3-5 years if you are 18-39 years of age. ? Every year if you are 40 years old or older. Diabetes Have regular diabetes screenings. This checks your fasting blood sugar level. Have the screening done:  Once every three years after age 40 if you are at a normal weight and have a low risk for diabetes.  More often and at a younger age if you are overweight or have a high risk for diabetes. What should I know about preventing infection? Hepatitis B If you have a higher risk for hepatitis B, you should be screened for this virus. Talk with your health care provider to find out if you are at risk for hepatitis B infection. Hepatitis C Testing is recommended for:  Everyone born from 1945 through 1965.  Anyone with known risk factors for hepatitis C. Sexually transmitted infections (STIs)  Get screened for STIs, including gonorrhea and chlamydia, if: ? You are sexually active and are younger than 50 years of age. ? You are older than 50 years of age and your health care provider tells you that you are at risk for this type of infection. ? Your sexual activity has changed since you were last screened,   you are at increased risk for chlamydia or gonorrhea. Ask your health care provider if you are at risk.  Ask your health care provider about whether you are at high risk for HIV. Your health care provider may recommend a prescription medicine to help prevent HIV infection. If you choose to take medicine to prevent HIV, you should first get tested for HIV. You should then be tested every 3 months for as long as you are taking  the medicine. Pregnancy  If you are about to stop having your period (premenopausal) and you may become pregnant, seek counseling before you get pregnant.  Take 400 to 800 micrograms (mcg) of folic acid every day if you become pregnant.  Ask for birth control (contraception) if you want to prevent pregnancy. Osteoporosis and menopause Osteoporosis is a disease in which the bones lose minerals and strength with aging. This can result in bone fractures. If you are 58 years old or older, or if you are at risk for osteoporosis and fractures, ask your health care provider if you should:  Be screened for bone loss.  Take a calcium or vitamin D supplement to lower your risk of fractures.  Be given hormone replacement therapy (HRT) to treat symptoms of menopause. Follow these instructions at home: Lifestyle  Do not use any products that contain nicotine or tobacco, such as cigarettes, e-cigarettes, and chewing tobacco. If you need help quitting, ask your health care provider.  Do not use street drugs.  Do not share needles.  Ask your health care provider for help if you need support or information about quitting drugs. Alcohol use  Do not drink alcohol if: ? Your health care provider tells you not to drink. ? You are pregnant, may be pregnant, or are planning to become pregnant.  If you drink alcohol: ? Limit how much you use to 0-1 drink a day. ? Limit intake if you are breastfeeding.  Be aware of how much alcohol is in your drink. In the [place] one drink equals one 12 oz bottle of beer (355 mL), one 5 oz glass of wine (148 mL), or one 1 oz glass of hard liquor (44 mL). General instructions  Schedule regular health, dental, and eye exams.  Stay current with your vaccines.  Tell your health care provider if: ? You often feel depressed. ? You have ever been abused or do not feel safe at home. Summary  Adopting a healthy lifestyle and getting preventive care are important in  promoting health and wellness.  Follow your health care provider's instructions about healthy diet, exercising, and getting tested or screened for diseases.  Follow your health care provider's instructions on monitoring your cholesterol and blood pressure. This information is not intended to replace advice given to you by your health care provider. Make sure you discuss any questions you have with your health care provider. Document Revised: 01/02/2018 Document Reviewed: 01/02/2018 Elsevier Patient Education  2021 [employer].

## 2020-05-21 LAB — COMPREHENSIVE METABOLIC PANEL
AG Ratio: 1.1 (calc) (ref 1.0–2.5)
ALT: 16 U/L (ref 6–29)
AST: 19 U/L (ref 10–35)
Albumin: 4 g/dL (ref 3.6–5.1)
Alkaline phosphatase (APISO): 68 U/L (ref 37–153)
BUN/Creatinine Ratio: 14 (calc) (ref 6–22)
BUN: 18 mg/dL (ref 7–25)
CO2: 28 mmol/L (ref 20–32)
Calcium: 9.8 mg/dL (ref 8.6–10.4)
Chloride: 104 mmol/L (ref 98–110)
Creat: 1.26 mg/dL — ABNORMAL HIGH (ref 0.50–1.05)
Globulin: 3.7 g/dL (calc) (ref 1.9–3.7)
Glucose, Bld: 81 mg/dL (ref 65–99)
Potassium: 4.5 mmol/L (ref 3.5–5.3)
Sodium: 140 mmol/L (ref 135–146)
Total Bilirubin: 0.4 mg/dL (ref 0.2–1.2)
Total Protein: 7.7 g/dL (ref 6.1–8.1)

## 2020-05-21 LAB — CYTOLOGY - PAP
Comment: NEGATIVE
Diagnosis: NEGATIVE
High risk HPV: NEGATIVE

## 2020-05-24 LAB — COMPLETE METABOLIC PANEL WITH GFR
AG Ratio: 1.1 (calc) (ref 1.0–2.5)
ALT: 16 U/L (ref 6–29)
AST: 19 U/L (ref 10–35)
Albumin: 4 g/dL (ref 3.6–5.1)
Alkaline phosphatase (APISO): 68 U/L (ref 37–153)
BUN/Creatinine Ratio: 14 (calc) (ref 6–22)
BUN: 18 mg/dL (ref 7–25)
CO2: 28 mmol/L (ref 20–32)
Calcium: 9.8 mg/dL (ref 8.6–10.4)
Chloride: 104 mmol/L (ref 98–110)
Creat: 1.26 mg/dL — ABNORMAL HIGH (ref 0.50–1.05)
GFR, Est African American: 58 mL/min/{1.73_m2} — ABNORMAL LOW (ref >=60)
GFR, Est Non African American: 50 mL/min/{1.73_m2} — ABNORMAL LOW (ref >=60)
Globulin: 3.7 g/dL (calc) (ref 1.9–3.7)
Glucose, Bld: 81 mg/dL (ref 65–99)
Potassium: 4.5 mmol/L (ref 3.5–5.3)
Sodium: 140 mmol/L (ref 135–146)
Total Bilirubin: 0.4 mg/dL (ref 0.2–1.2)
Total Protein: 7.7 g/dL (ref 6.1–8.1)

## 2020-05-24 LAB — CBC WITH DIFFERENTIAL/PLATELET
Absolute Monocytes: 291 cells/uL (ref 200–950)
Basophils Absolute: 29 cells/uL (ref 0–200)
Basophils Relative: 0.5 %
Eosinophils Absolute: 148 cells/uL (ref 15–500)
Eosinophils Relative: 2.6 %
HCT: 37.7 % (ref 35.0–45.0)
Hemoglobin: 12.6 g/dL (ref 11.7–15.5)
Lymphs Abs: 1573 cells/uL (ref 850–3900)
MCH: 31 pg (ref 27.0–33.0)
MCHC: 33.4 g/dL (ref 32.0–36.0)
MCV: 92.9 fL (ref 80.0–100.0)
MPV: 9.5 fL (ref 7.5–12.5)
Monocytes Relative: 5.1 %
Neutro Abs: 3659 cells/uL (ref 1500–7800)
Neutrophils Relative %: 64.2 %
Platelets: 348 10*3/uL (ref 140–400)
RBC: 4.06 10*6/uL (ref 3.80–5.10)
RDW: 13.8 % (ref 11.0–15.0)
Total Lymphocyte: 27.6 %
WBC: 5.7 10*3/uL (ref 3.8–10.8)

## 2020-05-28 ENCOUNTER — Other Ambulatory Visit: Payer: Self-pay | Admitting: Nurse Practitioner

## 2020-05-28 DIAGNOSIS — N921 Excessive and frequent menstruation with irregular cycle: Secondary | ICD-10-CM

## 2020-05-28 DIAGNOSIS — D500 Iron deficiency anemia secondary to blood loss (chronic): Secondary | ICD-10-CM

## 2020-06-18 DIAGNOSIS — M67911 Unspecified disorder of synovium and tendon, right shoulder: Secondary | ICD-10-CM | POA: Diagnosis not present

## 2020-07-19 DIAGNOSIS — M67911 Unspecified disorder of synovium and tendon, right shoulder: Secondary | ICD-10-CM | POA: Diagnosis not present

## 2020-07-22 DIAGNOSIS — M25611 Stiffness of right shoulder, not elsewhere classified: Secondary | ICD-10-CM | POA: Diagnosis not present

## 2020-07-22 DIAGNOSIS — M25511 Pain in right shoulder: Secondary | ICD-10-CM | POA: Diagnosis not present

## 2020-07-22 DIAGNOSIS — M7541 Impingement syndrome of right shoulder: Secondary | ICD-10-CM | POA: Diagnosis not present

## 2020-07-22 DIAGNOSIS — Z03818 Encounter for observation for suspected exposure to other biological agents ruled out: Secondary | ICD-10-CM | POA: Diagnosis not present

## 2020-07-28 DIAGNOSIS — M25611 Stiffness of right shoulder, not elsewhere classified: Secondary | ICD-10-CM | POA: Diagnosis not present

## 2020-07-28 DIAGNOSIS — M25511 Pain in right shoulder: Secondary | ICD-10-CM | POA: Diagnosis not present

## 2020-07-28 DIAGNOSIS — M7541 Impingement syndrome of right shoulder: Secondary | ICD-10-CM | POA: Diagnosis not present

## 2020-08-13 DIAGNOSIS — M25511 Pain in right shoulder: Secondary | ICD-10-CM | POA: Diagnosis not present

## 2020-08-13 DIAGNOSIS — M7541 Impingement syndrome of right shoulder: Secondary | ICD-10-CM | POA: Diagnosis not present

## 2020-08-13 DIAGNOSIS — I10 Essential (primary) hypertension: Secondary | ICD-10-CM | POA: Diagnosis not present

## 2020-08-13 DIAGNOSIS — N921 Excessive and frequent menstruation with irregular cycle: Secondary | ICD-10-CM | POA: Diagnosis not present

## 2020-08-13 DIAGNOSIS — D6859 Other primary thrombophilia: Secondary | ICD-10-CM | POA: Diagnosis not present

## 2020-08-13 DIAGNOSIS — R7303 Prediabetes: Secondary | ICD-10-CM | POA: Diagnosis not present

## 2020-08-13 DIAGNOSIS — M25611 Stiffness of right shoulder, not elsewhere classified: Secondary | ICD-10-CM | POA: Diagnosis not present

## 2020-08-13 DIAGNOSIS — F419 Anxiety disorder, unspecified: Secondary | ICD-10-CM | POA: Diagnosis not present

## 2020-08-13 DIAGNOSIS — Z0000 Encounter for general adult medical examination without abnormal findings: Secondary | ICD-10-CM | POA: Diagnosis not present

## 2020-08-16 ENCOUNTER — Other Ambulatory Visit: Payer: Self-pay | Admitting: Family Medicine

## 2020-08-16 DIAGNOSIS — Z1231 Encounter for screening mammogram for malignant neoplasm of breast: Secondary | ICD-10-CM

## 2020-08-26 DIAGNOSIS — M25511 Pain in right shoulder: Secondary | ICD-10-CM | POA: Diagnosis not present

## 2020-08-26 DIAGNOSIS — M25611 Stiffness of right shoulder, not elsewhere classified: Secondary | ICD-10-CM | POA: Diagnosis not present

## 2020-08-26 DIAGNOSIS — M7541 Impingement syndrome of right shoulder: Secondary | ICD-10-CM | POA: Diagnosis not present

## 2020-09-01 DIAGNOSIS — H66001 Acute suppurative otitis media without spontaneous rupture of ear drum, right ear: Secondary | ICD-10-CM | POA: Diagnosis not present

## 2020-09-01 DIAGNOSIS — R0981 Nasal congestion: Secondary | ICD-10-CM | POA: Diagnosis not present

## 2020-10-07 ENCOUNTER — Other Ambulatory Visit: Payer: Self-pay

## 2020-10-07 ENCOUNTER — Ambulatory Visit
Admission: RE | Admit: 2020-10-07 | Discharge: 2020-10-07 | Disposition: A | Discharge status: Home / Self Care | Payer: BC Managed Care – PPO | Source: Ambulatory Visit | Attending: Family Medicine | Admitting: Family Medicine

## 2020-10-07 DIAGNOSIS — Z1231 Encounter for screening mammogram for malignant neoplasm of breast: Secondary | ICD-10-CM | POA: Diagnosis not present

## 2020-10-10 DIAGNOSIS — R3989 Other symptoms and signs involving the genitourinary system: Secondary | ICD-10-CM | POA: Diagnosis not present

## 2020-10-10 DIAGNOSIS — N3001 Acute cystitis with hematuria: Secondary | ICD-10-CM | POA: Diagnosis not present

## 2020-10-10 DIAGNOSIS — R300 Dysuria: Secondary | ICD-10-CM | POA: Diagnosis not present

## 2020-10-11 ENCOUNTER — Other Ambulatory Visit: Payer: Self-pay | Admitting: Internal Medicine

## 2020-10-11 DIAGNOSIS — E041 Nontoxic single thyroid nodule: Secondary | ICD-10-CM

## 2020-10-28 ENCOUNTER — Ambulatory Visit
Admission: RE | Admit: 2020-10-28 | Discharge: 2020-10-28 | Disposition: A | Discharge status: Home / Self Care | Payer: BC Managed Care – PPO | Source: Ambulatory Visit | Attending: Internal Medicine | Admitting: Internal Medicine

## 2020-10-28 DIAGNOSIS — E041 Nontoxic single thyroid nodule: Secondary | ICD-10-CM

## 2020-11-04 DIAGNOSIS — Z6833 Body mass index (BMI) 33.0-33.9, adult: Secondary | ICD-10-CM | POA: Diagnosis not present

## 2020-11-04 DIAGNOSIS — N920 Excessive and frequent menstruation with regular cycle: Secondary | ICD-10-CM | POA: Diagnosis not present

## 2020-11-04 DIAGNOSIS — R7303 Prediabetes: Secondary | ICD-10-CM | POA: Diagnosis not present

## 2020-11-04 DIAGNOSIS — E041 Nontoxic single thyroid nodule: Secondary | ICD-10-CM | POA: Diagnosis not present

## 2020-11-12 ENCOUNTER — Telehealth: Payer: Self-pay | Admitting: *Deleted

## 2020-11-12 NOTE — Telephone Encounter (Signed)
Pt complains of irregular, frequent periods.LMP 10/07/2020. Periods comes on and off weekly. Patient would like an adjustment to birth control or something to regulate her period. I will route to Provider for recommendations.

## 2020-11-12 NOTE — Telephone Encounter (Signed)
Pt aware of recommendations. [facility] appointment team aware to call and schedule

## 2020-11-12 NOTE — Telephone Encounter (Signed)
Patient will need to be seen for an office visit so we can help her best.

## 2020-11-24 ENCOUNTER — Encounter: Payer: Self-pay | Admitting: Nurse Practitioner

## 2020-11-24 ENCOUNTER — Other Ambulatory Visit: Payer: Self-pay

## 2020-11-24 ENCOUNTER — Ambulatory Visit (INDEPENDENT_AMBULATORY_CARE_PROVIDER_SITE_OTHER): Payer: BC Managed Care – PPO | Admitting: Nurse Practitioner

## 2020-11-24 VITALS — BP 126/88

## 2020-11-24 DIAGNOSIS — M545 Low back pain, unspecified: Secondary | ICD-10-CM | POA: Diagnosis not present

## 2020-11-24 DIAGNOSIS — G8929 Other chronic pain: Secondary | ICD-10-CM

## 2020-11-24 DIAGNOSIS — N920 Excessive and frequent menstruation with regular cycle: Secondary | ICD-10-CM | POA: Diagnosis not present

## 2020-11-24 DIAGNOSIS — D500 Iron deficiency anemia secondary to blood loss (chronic): Secondary | ICD-10-CM

## 2020-11-24 DIAGNOSIS — N939 Abnormal uterine and vaginal bleeding, unspecified: Secondary | ICD-10-CM

## 2020-11-24 LAB — CBC WITH DIFFERENTIAL/PLATELET
Absolute Monocytes: 360 cells/uL (ref 200–950)
Basophils Absolute: 18 cells/uL (ref 0–200)
Basophils Relative: 0.3 %
Eosinophils Absolute: 120 cells/uL (ref 15–500)
Eosinophils Relative: 2 %
HCT: 41.6 % (ref 35.0–45.0)
Hemoglobin: 14.2 g/dL (ref 11.7–15.5)
Lymphs Abs: 1878 cells/uL (ref 850–3900)
MCH: 31.9 pg (ref 27.0–33.0)
MCHC: 34.1 g/dL (ref 32.0–36.0)
MCV: 93.5 fL (ref 80.0–100.0)
MPV: 9.6 fL (ref 7.5–12.5)
Monocytes Relative: 6 %
Neutro Abs: 3624 cells/uL (ref 1500–7800)
Neutrophils Relative %: 60.4 %
Platelets: 373 10*3/uL (ref 140–400)
RBC: 4.45 10*6/uL (ref 3.80–5.10)
RDW: 12.6 % (ref 11.0–15.0)
Total Lymphocyte: 31.3 %
WBC: 6 10*3/uL (ref 3.8–10.8)

## 2020-11-24 NOTE — Progress Notes (Signed)
   Acute Office Visit  Subjective:    Patient ID: Samantha Moon, female    DOB: 1970/12/22, 50 y.o.   MRN: [id number]   HPI 50 y.o. presents today for frequent, prolonged menses. She has cycles every 21 days with 7 days of bleeding. This is not new for her and flow has improved some on Micronor. But LMP 10/07/2020 she had her normal 7 day period but then experienced off and on bleeding until 10/20. Bleeding was mostly light. She was interested in IUD but due to [place] it was not covered by insurance. Bleeding is affecting her life and she wants to discuss other options. Ultrasound 09/2019 showed small myomas with largest measuring 3 x 2.7 cm. 10/2019 hysteroscopy with D & C due to menorrhagia. Bleeding did not improve following procedure. History of anemia s/t menstrual bleeding, taking iron supplement. History of DVT. She had a UTI a couple of months ago and says her back has started aching the last few days and she is worried it is another one, no other urinary symptoms.    Review of Systems  Constitutional: Negative.   Genitourinary:  Positive for menstrual problem. Negative for dysuria, frequency, pelvic pain and urgency.  Musculoskeletal:  Positive for back pain.      Objective:    Physical Exam Constitutional:      Appearance: Normal appearance.  Abdominal:     Tenderness: There is no abdominal tenderness. There is no right CVA tenderness or left CVA tenderness.  Genitourinary:    General: Normal vulva.     Vagina: Normal.     Cervix: Normal.     Uterus: Normal.     BP 126/88   LMP 10/07/2020  Wt Readings from Last 3 Encounters:  05/20/20 208 lb (94.3 kg)  10/28/19 213 lb (96.6 kg)  08/31/19 216 lb (98 kg)   UA negative leukocytes, negative nitrates, 2+ blood, 1+ protein, clear/dark yellow, SG 1.020. Microscopic: wbc 0-5, rbc 10-20, moderate bacteria     Assessment & Plan:   Problem List Items Addressed This Visit   None Visit Diagnoses     Abnormal uterine bleeding  (AUB)    -  Primary   Relevant Orders   CBC with Differential/Platelet   Chronic bilateral low back pain without sciatica       Relevant Orders   Urinalysis,Complete w/RFL Culture   Unusually frequent menses       Iron deficiency anemia due to chronic blood loss       Relevant Orders   CBC with Differential/Platelet      Plan: CBC today. Continue iron supplement. Will schedule visit with MD to discuss possible surgical options. Continue Micronor. UA unremarkable - small amount of RBCs, history of idiopathic hematuria. She is agreeable to plan.      [place], 12:19 PM 11/24/2020

## 2020-11-26 LAB — URINE CULTURE
MICRO NUMBER:: 12583558
SPECIMEN QUALITY:: ADEQUATE

## 2020-11-26 LAB — URINALYSIS, COMPLETE W/RFL CULTURE
Bilirubin Urine: NEGATIVE
Glucose, UA: NEGATIVE
Hyaline Cast: NONE SEEN /LPF
Leukocyte Esterase: NEGATIVE
Nitrites, Initial: NEGATIVE
Specific Gravity, Urine: 1.02 (ref 1.001–1.035)
pH: 6 (ref 5.0–8.0)

## 2020-11-26 LAB — CULTURE INDICATED

## 2020-12-18 ENCOUNTER — Emergency Department (HOSPITAL_BASED_OUTPATIENT_CLINIC_OR_DEPARTMENT_OTHER): Payer: BC Managed Care – PPO

## 2020-12-18 ENCOUNTER — Encounter (HOSPITAL_BASED_OUTPATIENT_CLINIC_OR_DEPARTMENT_OTHER): Payer: Self-pay

## 2020-12-18 ENCOUNTER — Other Ambulatory Visit: Payer: Self-pay

## 2020-12-18 ENCOUNTER — Emergency Department (HOSPITAL_BASED_OUTPATIENT_CLINIC_OR_DEPARTMENT_OTHER)
Admission: EM | Admit: 2020-12-18 | Discharge: 2020-12-18 | Disposition: A | Discharge status: Home / Self Care | Payer: BC Managed Care – PPO | Attending: Emergency Medicine | Admitting: Emergency Medicine

## 2020-12-18 DIAGNOSIS — M79605 Pain in left leg: Secondary | ICD-10-CM | POA: Diagnosis not present

## 2020-12-18 DIAGNOSIS — X58XXXA Exposure to other specified factors, initial encounter: Secondary | ICD-10-CM | POA: Diagnosis not present

## 2020-12-18 DIAGNOSIS — Z7982 Long term (current) use of aspirin: Secondary | ICD-10-CM | POA: Diagnosis not present

## 2020-12-18 DIAGNOSIS — Z7901 Long term (current) use of anticoagulants: Secondary | ICD-10-CM | POA: Diagnosis not present

## 2020-12-18 DIAGNOSIS — S8992XA Unspecified injury of left lower leg, initial encounter: Secondary | ICD-10-CM | POA: Diagnosis not present

## 2020-12-18 DIAGNOSIS — I10 Essential (primary) hypertension: Secondary | ICD-10-CM | POA: Insufficient documentation

## 2020-12-18 DIAGNOSIS — M7989 Other specified soft tissue disorders: Secondary | ICD-10-CM | POA: Diagnosis not present

## 2020-12-18 DIAGNOSIS — S8012XA Contusion of left lower leg, initial encounter: Secondary | ICD-10-CM | POA: Diagnosis not present

## 2020-12-18 DIAGNOSIS — S8002XA Contusion of left knee, initial encounter: Secondary | ICD-10-CM | POA: Diagnosis not present

## 2020-12-18 DIAGNOSIS — Z79899 Other long term (current) drug therapy: Secondary | ICD-10-CM | POA: Insufficient documentation

## 2020-12-18 DIAGNOSIS — R58 Hemorrhage, not elsewhere classified: Secondary | ICD-10-CM

## 2020-12-18 NOTE — ED Triage Notes (Signed)
L Leg pain and bruise since Tuesday; Pt reports h/x blood clots.

## 2020-12-18 NOTE — ED Provider Notes (Signed)
[facility] EMERGENCY DEPARTMENT Provider Note   CSN: [id number] Arrival date & time: 12/18/20  1519     History Chief Complaint  Patient presents with   Leg Pain    Samantha Moon is a 50 y.o. female.  Patient with history of DVT due to "vitamin deficiency", was on anticoagulation currently on aspirin only --presents to the emergency department today for evaluation of left leg soreness and bruising.  Symptoms started about 4 days ago.  She denies any known trauma.  She had some soreness in the leg and calf.  Yesterday she developed a bruise and ecchymosis noted.  No chest pain or shortness of breath.  No other issues.  The onset of this condition was acute. The course is constant. Aggravating factors: none. Alleviating factors: none.        Past Medical History:  Diagnosis Date   Abnormal uterine bleeding (AUB)    Anemia    DVT (deep venous thrombosis) ([facility]) 2019   x 2 one in each leg at different times   Heart murmur    echo done 09-05-2019   Hx gestational diabetes    Hypertension     Patient Active Problem List   Diagnosis Date Noted   Folliculitis 12/45/8099   History of gestational diabetes 05/11/2011    Past Surgical History:  Procedure Laterality Date   DILATATION & CURETTAGE/HYSTEROSCOPY WITH MYOSURE N/A 10/28/2019   Procedure: DILATATION & CURETTAGE/HYSTEROSCOPY;  Surgeon: Joseph Pierini, MD;  Location: [facility];  Service: Gynecology;  Laterality: N/A;  request 7:30am OR time in [place] [facility] block requests 30 minutes   HERNIA REPAIR  1991   inguinal   TUBAL LIGATION  2000   BTL     OB History     Gravida  3   Para  2   Term      Preterm      AB  1   Living  2      SAB  1   IAB      Ectopic      Multiple      Live Births              Family History  Problem Relation Age of Onset   Hypertension Mother    Stroke Mother    Hypertension Father    Hypertension Brother    Breast cancer Paternal  Aunt     Social History   Tobacco Use   Smoking status: Never   Smokeless tobacco: Never  Vaping Use   Vaping Use: Never used  Substance Use Topics   Alcohol use: Yes    Comment: occasional   Drug use: No    Home Medications Prior to Admission medications   Medication Sig Start Date End Date Taking? Authorizing Provider  amLODipine-benazepril (LOTREL) 5-10 MG capsule Take 1 capsule by mouth daily.    [provider]  aspirin EC 81 MG tablet Take 81 mg by mouth daily.    [provider]  cetirizine (ZYRTEC) 10 MG tablet Take 10 mg by mouth daily. Reported on 05/05/2015    [provider]  clonazePAM (KLONOPIN) 0.5 MG tablet Take 0.5 mg by mouth 2 (two) times daily as needed for anxiety. Reported on 05/05/2015    [provider]  Ferrous Sulfate (IRON PO) Take by mouth.     [provider]  ferrous sulfate 325 (65 FE) MG EC tablet Take 1 tablet (325 mg total) by mouth 2 (two)  times daily with a meal. 02/24/20 05/24/20  [name] A, NP  norethindrone (MICRONOR) 0.35 MG tablet Take 1 tablet (0.35 mg total) by mouth daily. 05/20/20   Tamela Gammon, NP    Allergies    Nitrofurantoin monohyd macro and Sudafed [pseudoephedrine hcl]  Review of Systems   Review of Systems  Constitutional:  Negative for activity change.  Musculoskeletal:  Positive for myalgias.  Skin:  Positive for color change. Negative for wound.  Neurological:  Negative for weakness and numbness.   Physical Exam Updated Vital Signs BP (!) 161/86 (BP Location: Right Arm)   Pulse 68   Temp 98.6 F (37 C) (Oral)   Resp 18   Ht 5\' 8"  (1.727 m)   Wt 95.3 kg   LMP 09/23/2020 (Approximate)   SpO2 93%   BMI 31.93 kg/m   Physical Exam Vitals and nursing note reviewed.  Constitutional:      Appearance: She is well-developed.  HENT:     Head: Normocephalic and atraumatic.  Eyes:     Pupils: Pupils are equal, round, and reactive to light.  Cardiovascular:      Pulses: Normal pulses. No decreased pulses.  Musculoskeletal:        General: Tenderness present.     Cervical back: Normal range of motion and neck supple.     Comments: No obvious edema of the thigh or calf.  Patient does have an area of ecchymosis over the proximal calf area with mild focal tenderness and firmness.  Skin:    General: Skin is warm and dry.  Neurological:     Mental Status: She is alert.     Sensory: No sensory deficit.     Comments: Motor, sensation, and vascular distal to the injury is fully intact.   Psychiatric:        Mood and Affect: Mood normal.    ED Results / Procedures / Treatments   Labs (all labs ordered are listed, but only abnormal results are displayed) Labs Reviewed - No data to display  EKG None  Radiology US Venous Img Lower  Left (DVT Study)  Result Date: 12/18/2020 CLINICAL DATA:  Left leg pain and swelling for 4 days. EXAM: Left LOWER EXTREMITY VENOUS DOPPLER ULTRASOUND TECHNIQUE: Gray-scale sonography with compression, as well as color and duplex ultrasound, were performed to evaluate the deep venous system(s) from the level of the common femoral vein through the popliteal and proximal calf veins. COMPARISON:  Left lower extremity venous Doppler ultrasound 07/06/2017 FINDINGS: VENOUS Normal compressibility of the common femoral, superficial femoral, and popliteal veins, as well as the visualized calf veins. Visualized portions of profunda femoral vein and great saphenous vein unremarkable. No filling defects to suggest DVT on grayscale or color Doppler imaging. Doppler waveforms show normal direction of venous flow, normal respiratory plasticity and response to augmentation. Limited views of the contralateral common femoral vein are unremarkable. OTHER Patient requested imaging at location of a bruise at the medial aspect of the knee. There is increased echogenicity of the subcutaneous fat directly underlying the bruise, consistent with posttraumatic  fat necrosis. Limitations: none IMPRESSION: No evidence of deep venous thrombosis in the left lower extremity. Electronically Signed   By: Ileana Roup M.D.   On: 12/18/2020 18:38    Procedures Procedures   Medications Ordered in ED Medications - No data to display  ED Course  I have reviewed the triage vital signs and the nursing notes.  Pertinent labs & imaging results that were available  during my care of the patient were reviewed by me and considered in my medical decision making (see chart for details).  Patient seen and examined. DVT study pending.    Vital signs reviewed and are as follows: BP (!) 161/86 (BP Location: Right Arm)   Pulse 68   Temp 98.6 F (37 C) (Oral)   Resp 18   Ht 5\' 8"  (1.727 m)   Wt 95.3 kg   LMP 09/23/2020 (Approximate)   SpO2 93%   BMI 31.93 kg/m   DVT study neg. Pt updated.  She will continue aspirin.  Discussed rice protocol.    MDM Rules/Calculators/A&P                           Patient with calf tenderness and now area of bruising.  Patient does not remember any trauma.  No other areas of bruising.  Given her history of DVT, here for DVT rule out.  This was negative.  Patient looks well.  She will continue symptomatic treatment.   Final Clinical Impression(s) / ED Diagnoses Final diagnoses:  Left leg pain  Ecchymosis    Rx / DC Orders ED Discharge Orders     None        Carlisle Cater, PA-C 12/18/20 1910    Blanchie Dessert, MD 12/23/20 1328

## 2020-12-18 NOTE — Discharge Instructions (Signed)
Please read and follow all provided instructions.  Your diagnoses today include:  1. Left leg pain   2. Ecchymosis     Tests performed today include: An ultrasound of the affected area - no blood clots Vital signs. See below for your results today.   Medications prescribed:  None  Take any prescribed medications only as directed.  Home care instructions:  Follow any educational materials contained in this packet Follow R.I.C.E. Protocol: R - rest your injury  I  - use ice on injury without applying directly to skin C - compress injury with bandage or splint E - elevate the injury as much as possible  Follow-up instructions: Please follow-up with your primary care provider if you continue to have significant pain in 1 week.   Return instructions:  Please return if your toes or feet are numb or tingling, appear gray or blue, or you have severe pain (also elevate the leg and loosen splint or wrap if you were given one) Please return to the Emergency Department if you experience worsening symptoms.  Please return if you have any other emergent concerns.  Additional Information:  Your vital signs today were: BP (!) 161/86 (BP Location: Right Arm)   Pulse 68   Temp 98.6 F (37 C) (Oral)   Resp 18   Ht 5\' 8"  (1.727 m)   Wt 95.3 kg   LMP 09/23/2020 (Approximate)   SpO2 93%   BMI 31.93 kg/m  If your blood pressure (BP) was elevated above 135/85 this visit, please have this repeated by your doctor within one month. --------------

## 2021-01-20 ENCOUNTER — Ambulatory Visit: Payer: BC Managed Care – PPO | Admitting: Obstetrics and Gynecology

## 2021-02-02 ENCOUNTER — Other Ambulatory Visit: Payer: BC Managed Care – PPO

## 2021-03-24 DIAGNOSIS — M170 Bilateral primary osteoarthritis of knee: Secondary | ICD-10-CM | POA: Diagnosis not present

## 2021-03-24 DIAGNOSIS — M1711 Unilateral primary osteoarthritis, right knee: Secondary | ICD-10-CM | POA: Diagnosis not present

## 2021-03-24 DIAGNOSIS — M1712 Unilateral primary osteoarthritis, left knee: Secondary | ICD-10-CM | POA: Diagnosis not present

## 2021-04-21 DIAGNOSIS — E041 Nontoxic single thyroid nodule: Secondary | ICD-10-CM | POA: Diagnosis not present

## 2021-04-21 DIAGNOSIS — R7303 Prediabetes: Secondary | ICD-10-CM | POA: Diagnosis not present

## 2021-05-05 DIAGNOSIS — E041 Nontoxic single thyroid nodule: Secondary | ICD-10-CM | POA: Diagnosis not present

## 2021-05-05 DIAGNOSIS — N920 Excessive and frequent menstruation with regular cycle: Secondary | ICD-10-CM | POA: Diagnosis not present

## 2021-05-05 DIAGNOSIS — Z6831 Body mass index (BMI) 31.0-31.9, adult: Secondary | ICD-10-CM | POA: Diagnosis not present

## 2021-05-05 DIAGNOSIS — R7303 Prediabetes: Secondary | ICD-10-CM | POA: Diagnosis not present

## 2021-05-19 ENCOUNTER — Ambulatory Visit (INDEPENDENT_AMBULATORY_CARE_PROVIDER_SITE_OTHER): Payer: BC Managed Care – PPO | Admitting: Nurse Practitioner

## 2021-05-19 ENCOUNTER — Encounter: Payer: Self-pay | Admitting: Nurse Practitioner

## 2021-05-19 VITALS — BP 120/82 | Ht 67.5 in | Wt 208.0 lb

## 2021-05-19 DIAGNOSIS — N926 Irregular menstruation, unspecified: Secondary | ICD-10-CM | POA: Diagnosis not present

## 2021-05-19 DIAGNOSIS — Z01419 Encounter for gynecological examination (general) (routine) without abnormal findings: Secondary | ICD-10-CM | POA: Diagnosis not present

## 2021-05-19 NOTE — Progress Notes (Signed)
? ?  Samantha Moon 01/09/1971 [id number] ? ? ?History:  51 y.o. Samantha Moon presents for annual exam. LMP in February. History of menorrhagia. Stopped Norethindrone. Normal pap and mammogram history.  History of DVT.  Pre-diabetes, HTN managed by PCP.  ? ?Gynecologic History ?Patient's last menstrual period was 03/16/2021. ?Period Duration (Days): 7 ?Period Pattern: (!) Irregular ?Menstrual Flow: Moderate ?Dysmenorrhea: (!) Mild ?Dysmenorrhea Symptoms: Cramping ?Contraception/Family planning: tubal ligation ?Sexually active: Yes ? ?Health Maintenance ?Last Pap: 05/20/2020 Results were: Normal, 5-year repeat ?Last mammogram: 10/07/2020. Results were: Normal ?Last colonoscopy: Never. Scheduled 06/09/2021 ?Last Dexa: Never ? ?Past medical history, past surgical history, family history and social history were all reviewed and documented in the EPIC chart. 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  ? ?ROS:  A ROS was performed and pertinent positives and negatives are included. ? ?Exam: ? ?Vitals:  ? 05/19/21 1104  ?BP: 120/82  ?Weight: 208 lb (94.3 kg)  ?Height: 5' 7.5" (1.715 m)  ? ? ?Body mass index is 32.1 kg/m?. ? ?General appearance:  Normal ?Thyroid:  Symmetrical, normal in size, without palpable masses or nodularity. ?Respiratory ? Auscultation:  Clear without wheezing or rhonchi ?Cardiovascular ? Auscultation:  Regular rate, without rubs, murmurs or gallops ? Edema/varicosities:  Not grossly evident ?Abdominal ? Soft,nontender, without masses, guarding or rebound. ? Liver/spleen:  No organomegaly noted ? Hernia:  None appreciated ? Skin ? Inspection:  Grossly normal ?Breasts: Examined lying and sitting.  ? Right: Without masses, retractions, nipple discharge or axillary adenopathy. ? ? Left: Without masses, retractions, nipple discharge or axillary adenopathy. ?Gentitourinary  ? Inguinal/mons:  Normal without inguinal adenopathy ? External genitalia:  Normal appearing vulva with no masses, tenderness, or  lesions ? BUS/Urethra/Skene's glands:  Normal ? Vagina:  Normal appearing with normal color and discharge, no lesions ? Cervix:  Normal appearing without discharge or lesions ? Uterus:  Normal in size, shape and contour.  Midline and mobile, nontender ? Adnexa/parametria:   ?  Rt: Normal in size, without masses or tenderness. ?  Lt: Normal in size, without masses or tenderness. ? Anus and perineum: Normal ? Digital rectal exam: Normal sphincter tone without palpated masses or tenderness ? ?Patient informed chaperone available to be present for breast and pelvic exam. Patient has requested no chaperone to be present. Patient has been advised what will be completed during breast and pelvic exam.  ? ?Assessment/Plan:  51 y.o. [id number] for annual exam.  ? ?Well female exam with routine gynecological exam - Education provided on SBEs, importance of preventative screenings, current guidelines, high calcium diet, regular exercise, and multivitamin daily.  Labs with PCP.  ? ?Missed menses - Likely perimenopausal. Discussed what to expect during transition. She will reach out if she develops heavy or frequent menses.  ? ?Screening for cervical cancer - Normal Pap history.  Will repeat at 5-year interval per guidelines.   ? ?Screening for breast cancer - Normal mammogram history.  Continue annual screenings.  Normal breast exam today. ? ?Screening for colon cancer - Scheduled for colonoscopy next month.  ? ?Return in 1 year for annual.  ? ? ? ? ? ? ? ?Tamela Gammon DNP, 11:37 AM 05/19/2021 ? ?

## 2021-06-09 DIAGNOSIS — K648 Other hemorrhoids: Secondary | ICD-10-CM | POA: Diagnosis not present

## 2021-06-09 DIAGNOSIS — K5730 Diverticulosis of large intestine without perforation or abscess without bleeding: Secondary | ICD-10-CM | POA: Diagnosis not present

## 2021-06-09 DIAGNOSIS — Z1211 Encounter for screening for malignant neoplasm of colon: Secondary | ICD-10-CM | POA: Diagnosis not present

## 2021-06-09 DIAGNOSIS — D123 Benign neoplasm of transverse colon: Secondary | ICD-10-CM | POA: Diagnosis not present

## 2021-06-09 DIAGNOSIS — D120 Benign neoplasm of cecum: Secondary | ICD-10-CM | POA: Diagnosis not present

## 2021-06-13 DIAGNOSIS — D120 Benign neoplasm of cecum: Secondary | ICD-10-CM | POA: Diagnosis not present

## 2021-06-13 DIAGNOSIS — D123 Benign neoplasm of transverse colon: Secondary | ICD-10-CM | POA: Diagnosis not present

## 2021-06-30 ENCOUNTER — Other Ambulatory Visit: Payer: Self-pay | Admitting: Nurse Practitioner

## 2021-06-30 ENCOUNTER — Telehealth: Payer: Self-pay | Admitting: *Deleted

## 2021-06-30 DIAGNOSIS — N921 Excessive and frequent menstruation with irregular cycle: Secondary | ICD-10-CM

## 2021-06-30 MED ORDER — NORETHINDRONE 0.35 MG PO TABS: 1.0000 | ORAL_TABLET | Freq: Every day | ORAL | 1 refills | Status: DC

## 2021-06-30 NOTE — Telephone Encounter (Signed)
Patient said okay to send Rx into [facility].

## 2021-06-30 NOTE — Telephone Encounter (Signed)
If she is agreeable, I recommend restarting Norethindrone to help manage bleeding.

## 2021-06-30 NOTE — Telephone Encounter (Signed)
Patient called to report prolong bleeding reports LMP:06/19/21 and still bleeding today. Patient states bleeding started off light and then began heavy. Since Tuesday she has been changing over night pads every 2 hours. Patient was told to call if any heavy bleeding. Please advise

## 2021-08-19 DIAGNOSIS — Z0000 Encounter for general adult medical examination without abnormal findings: Secondary | ICD-10-CM | POA: Diagnosis not present

## 2021-08-19 DIAGNOSIS — F419 Anxiety disorder, unspecified: Secondary | ICD-10-CM | POA: Diagnosis not present

## 2021-08-19 DIAGNOSIS — E049 Nontoxic goiter, unspecified: Secondary | ICD-10-CM | POA: Diagnosis not present

## 2021-08-19 DIAGNOSIS — I129 Hypertensive chronic kidney disease with stage 1 through stage 4 chronic kidney disease, or unspecified chronic kidney disease: Secondary | ICD-10-CM | POA: Diagnosis not present

## 2021-08-19 DIAGNOSIS — R7303 Prediabetes: Secondary | ICD-10-CM | POA: Diagnosis not present

## 2021-08-25 DIAGNOSIS — R300 Dysuria: Secondary | ICD-10-CM | POA: Diagnosis not present

## 2021-09-01 DIAGNOSIS — H00024 Hordeolum internum left upper eyelid: Secondary | ICD-10-CM | POA: Diagnosis not present

## 2021-09-08 DIAGNOSIS — E041 Nontoxic single thyroid nodule: Secondary | ICD-10-CM | POA: Diagnosis not present

## 2021-09-08 DIAGNOSIS — Z6831 Body mass index (BMI) 31.0-31.9, adult: Secondary | ICD-10-CM | POA: Diagnosis not present

## 2021-09-08 DIAGNOSIS — R7301 Impaired fasting glucose: Secondary | ICD-10-CM | POA: Diagnosis not present

## 2021-09-08 DIAGNOSIS — R7303 Prediabetes: Secondary | ICD-10-CM | POA: Diagnosis not present

## 2021-11-11 ENCOUNTER — Telehealth: Payer: Self-pay

## 2021-11-11 DIAGNOSIS — N921 Excessive and frequent menstruation with irregular cycle: Secondary | ICD-10-CM

## 2021-11-11 MED ORDER — MEDROXYPROGESTERONE ACETATE 5 MG PO TABS: 5.0000 mg | ORAL_TABLET | Freq: Every day | ORAL | 0 refills | Status: DC

## 2021-11-11 NOTE — Telephone Encounter (Signed)
Dr.Lavoie replied "H/O DVT.  Can switch to Provera 5 mg PO daily.  Continue Iron supplement.  Schedule appointment with Tiffany for CBC and Pelvic US. "   Tiffany I sent in 30 tablet of provera 5 mg, I wanted you to be aware of the below and Dr.Lavoie recommendations as well.

## 2021-11-11 NOTE — Telephone Encounter (Signed)
I also inquired if pt had been continuously taking her iron. She said no but has recently started back since this continued bleeding/cycle.

## 2021-11-11 NOTE — Telephone Encounter (Signed)
Pt calling to report prolonged/heavy bleeding. Reports LMP: 10/31/2021 and is still having heavy bleeding with clotting today. Reports saturating tampon and ultra sized tampon q 3-4 hours. Pt was Rxd micronor in 06/2021 to help w/ management of her cycles. However, pt is reporting that she didn't start taking POPs until 11/06/2021. I inquired as to why and she stated that her cycles were manageable until now. Pt is wondering if she needs to be Rxd another medication since this Rx seems to not be helping. Seems like pt is looking for something to help slow/stop bleeding like provera or lysteda vs POPs. Please advise.

## 2021-11-14 ENCOUNTER — Other Ambulatory Visit: Payer: Self-pay | Admitting: Nurse Practitioner

## 2021-11-14 DIAGNOSIS — N939 Abnormal uterine and vaginal bleeding, unspecified: Secondary | ICD-10-CM

## 2021-11-14 MED ORDER — MEDROXYPROGESTERONE ACETATE 10 MG PO TABS: 10.0000 mg | ORAL_TABLET | Freq: Every day | ORAL | 0 refills | Status: DC

## 2021-11-14 NOTE — Telephone Encounter (Signed)
Patient informed with below note. Order placed for ultrasound.

## 2021-11-14 NOTE — Telephone Encounter (Signed)
I informed patient with below, report she started the Provera and it has not helped the bleeding. Patient said she is still wearing pad/tampon and saturating ever 3-4 hours during the day. Reports at bedtime she wears both tampon/pad and has to sleep with towel under her because she saturates tampon/pad.  Do you want her to stop provera and start back on Micronor 0.35 mg tablet and give it more time? Do you want her to still proceed with scheduling pelvic ultrasound as well?  Please advise

## 2021-11-14 NOTE — Telephone Encounter (Signed)
I will increase Provera to 10 mg daily x 10 days. She will restart Micronor the last couple days of the Provera course. I do agree with ultrasound.

## 2021-11-14 NOTE — Telephone Encounter (Signed)
Provera is fine, but I do think she needs to continue POPs as well. She just started them, so she needs more time for it to work.

## 2021-11-17 NOTE — Telephone Encounter (Signed)
Claudia left message for patient to call.

## 2021-11-18 ENCOUNTER — Encounter: Payer: Self-pay | Admitting: Nurse Practitioner

## 2021-11-28 ENCOUNTER — Telehealth: Payer: Self-pay | Admitting: Nurse Practitioner

## 2021-11-28 NOTE — Telephone Encounter (Signed)
Dr Dellis Filbert ordered an ultrasound on October 20 to be scheduled with [name] per nurse call. Message left on October 24 and letter mailed on October 27 for patient to call and schedule appointment. Patient has not called back to schedule.

## 2021-12-20 NOTE — Telephone Encounter (Signed)
I do not believe an U/S is needed at this time since her bleeding is well controlled on Micronor. If abnormal bleeding returns she should call office.

## 2021-12-20 NOTE — Telephone Encounter (Signed)
PUS for AUB  Spoke with patient. Patient states she completed Provera and started Micronor as advised. Reports irregular bleeding has stopped, last menses was "normal" and not as heavy. Denies any pain. Patient request to schedule PUS in 01/2022, scheduled for 02/02/22 at 1000, consult to follow with Tiffany. Patient ask that if PUS no longer needed to return call to notify and cancel. Advised I will review with Tiffany and f/u. Patient agreeable.   Tiffany, please review and advise on PUS.

## 2021-12-20 NOTE — Telephone Encounter (Signed)
Spoke with patient, advised per Tiffany.  PUS cancelled at this time. Order discontinued.  Patient verbalizes understanding and is agreeable.   Encounter closed.

## 2021-12-29 DIAGNOSIS — L080 Pyoderma: Secondary | ICD-10-CM | POA: Diagnosis not present

## 2021-12-29 DIAGNOSIS — L209 Atopic dermatitis, unspecified: Secondary | ICD-10-CM | POA: Diagnosis not present

## 2021-12-29 DIAGNOSIS — L309 Dermatitis, unspecified: Secondary | ICD-10-CM | POA: Diagnosis not present

## 2022-01-05 NOTE — Telephone Encounter (Signed)
Per encounter on 11/28/2021: PUS was cancelled  due to pt's cycles now well controlled by POPs. Will close encounter.

## 2022-01-19 ENCOUNTER — Other Ambulatory Visit: Payer: Self-pay | Admitting: Obstetrics & Gynecology

## 2022-02-02 ENCOUNTER — Other Ambulatory Visit: Payer: BC Managed Care – PPO | Admitting: Nurse Practitioner

## 2022-02-02 ENCOUNTER — Other Ambulatory Visit: Payer: BC Managed Care – PPO

## 2022-02-23 DIAGNOSIS — R399 Unspecified symptoms and signs involving the genitourinary system: Secondary | ICD-10-CM | POA: Diagnosis not present

## 2022-02-23 DIAGNOSIS — Z6832 Body mass index (BMI) 32.0-32.9, adult: Secondary | ICD-10-CM | POA: Diagnosis not present

## 2022-03-03 DIAGNOSIS — R7301 Impaired fasting glucose: Secondary | ICD-10-CM | POA: Diagnosis not present

## 2022-03-03 DIAGNOSIS — E041 Nontoxic single thyroid nodule: Secondary | ICD-10-CM | POA: Diagnosis not present

## 2022-03-10 DIAGNOSIS — I10 Essential (primary) hypertension: Secondary | ICD-10-CM | POA: Diagnosis not present

## 2022-03-10 DIAGNOSIS — R7309 Other abnormal glucose: Secondary | ICD-10-CM | POA: Diagnosis not present

## 2022-03-10 DIAGNOSIS — R7301 Impaired fasting glucose: Secondary | ICD-10-CM | POA: Diagnosis not present

## 2022-03-10 DIAGNOSIS — E041 Nontoxic single thyroid nodule: Secondary | ICD-10-CM | POA: Diagnosis not present

## 2022-03-16 DIAGNOSIS — Z6832 Body mass index (BMI) 32.0-32.9, adult: Secondary | ICD-10-CM | POA: Diagnosis not present

## 2022-03-16 DIAGNOSIS — F419 Anxiety disorder, unspecified: Secondary | ICD-10-CM | POA: Diagnosis not present

## 2022-03-16 DIAGNOSIS — Z79899 Other long term (current) drug therapy: Secondary | ICD-10-CM | POA: Diagnosis not present

## 2022-03-16 DIAGNOSIS — I10 Essential (primary) hypertension: Secondary | ICD-10-CM | POA: Diagnosis not present

## 2022-04-06 ENCOUNTER — Other Ambulatory Visit: Payer: Self-pay | Admitting: Nurse Practitioner

## 2022-04-06 ENCOUNTER — Telehealth: Payer: Self-pay

## 2022-04-06 DIAGNOSIS — N921 Excessive and frequent menstruation with irregular cycle: Secondary | ICD-10-CM

## 2022-04-06 MED ORDER — NORETHINDRONE 0.35 MG PO TABS: 1.0000 | ORAL_TABLET | Freq: Every day | ORAL | 0 refills | Status: DC

## 2022-04-06 NOTE — Telephone Encounter (Signed)
Patient was prescribed Micronor back in 06/2021 for #84 with one refill. That ran out in December an she has not taken any since. Periods were well controlled on Micronor.  She bled 2 weeks in January and stopped.  Then started bleeding again 03/09/22 and has bled every day since. Has a regular flow using 2 ultra tampons daily. Still bleeding today.  Requesting refill to get this bleeding to stop.  Last AEX 05/19/2021 Scheduled 05/25/2022

## 2022-04-06 NOTE — Telephone Encounter (Signed)
Refill provided. Thanks!

## 2022-04-06 NOTE — Telephone Encounter (Signed)
Left message in patient's voice mail that Rx sent. ?

## 2022-04-28 ENCOUNTER — Emergency Department (HOSPITAL_BASED_OUTPATIENT_CLINIC_OR_DEPARTMENT_OTHER)
Admission: EM | Admit: 2022-04-28 | Discharge: 2022-04-28 | Disposition: A | Discharge status: Home / Self Care | Payer: BC Managed Care – PPO | Attending: Emergency Medicine | Admitting: Emergency Medicine

## 2022-04-28 ENCOUNTER — Emergency Department (HOSPITAL_BASED_OUTPATIENT_CLINIC_OR_DEPARTMENT_OTHER): Payer: BC Managed Care – PPO

## 2022-04-28 ENCOUNTER — Encounter (HOSPITAL_BASED_OUTPATIENT_CLINIC_OR_DEPARTMENT_OTHER): Payer: Self-pay

## 2022-04-28 DIAGNOSIS — M79602 Pain in left arm: Secondary | ICD-10-CM | POA: Diagnosis not present

## 2022-04-28 DIAGNOSIS — M79622 Pain in left upper arm: Secondary | ICD-10-CM | POA: Diagnosis not present

## 2022-04-28 DIAGNOSIS — L03113 Cellulitis of right upper limb: Secondary | ICD-10-CM | POA: Insufficient documentation

## 2022-04-28 DIAGNOSIS — Z7982 Long term (current) use of aspirin: Secondary | ICD-10-CM | POA: Insufficient documentation

## 2022-04-28 DIAGNOSIS — Z79899 Other long term (current) drug therapy: Secondary | ICD-10-CM | POA: Diagnosis not present

## 2022-04-28 DIAGNOSIS — I10 Essential (primary) hypertension: Secondary | ICD-10-CM | POA: Diagnosis not present

## 2022-04-28 DIAGNOSIS — Z7984 Long term (current) use of oral hypoglycemic drugs: Secondary | ICD-10-CM | POA: Insufficient documentation

## 2022-04-28 MED ORDER — CEPHALEXIN 500 MG PO CAPS: ORAL_CAPSULE | ORAL | 0 refills | Status: DC

## 2022-04-28 NOTE — ED Triage Notes (Signed)
C/o left arm pain since Tuesday night. States some swelling and bruising. Hx of DVT in legs. Takes 81mg  aspirin, no other thinners.

## 2022-04-28 NOTE — ED Notes (Signed)
Discharge instructions discussed with pt. Pt verbalized understanding. Pt stable and ambulatory.  °

## 2022-04-28 NOTE — ED Provider Notes (Signed)
[place] [facility] AT [facility] Provider Note   CSN: [id number] Arrival date & time: 04/28/22  0940     History  Chief Complaint  Patient presents with   Arm Pain    Samantha Moon is a 52 y.o. female.  52 yo F with a chief complaints of left arm redness.  This is a small area to the inside of her left arm.  Is been there for couple days.  Denies injury denies insect sting denies trauma.  She is worried that maybe it is a DVT.  Has a history of a DVT in the past.   Arm Pain       Home Medications Prior to Admission medications   Medication Sig Start Date End Date Taking? Authorizing Provider  cephALEXin (KEFLEX) 500 MG capsule 2 caps po bid x 7 days 04/28/22  Yes Melene PlanFloyd, Bettyjane Shenoy, DO  amLODipine-benazepril (LOTREL) 5-10 MG capsule Take 1 capsule by mouth daily.    [provider]  aspirin EC 81 MG tablet Take 81 mg by mouth daily.    [provider]  cetirizine (ZYRTEC) 10 MG tablet Take 10 mg by mouth daily. Reported on 05/05/2015    [provider]  clonazePAM (KLONOPIN) 0.5 MG tablet Take 0.5 mg by mouth 2 (two) times daily as needed for anxiety. Reported on 05/05/2015    [provider]  Ferrous Sulfate (IRON PO) Take by mouth.     [provider]  ferrous sulfate 325 (65 FE) MG EC tablet Take 1 tablet (325 mg total) by mouth 2 (two) times daily with a meal. 02/24/20 05/24/20  Wyline BeadyWallace, Tiffany A, NP  metFORMIN (GLUCOPHAGE-XR) 750 MG 24 hr tablet SMARTSIG:1 Tablet(s) By Mouth Every Evening 05/05/21   [provider]  norethindrone (MICRONOR) 0.35 MG tablet Take 1 tablet (0.35 mg total) by mouth daily. 04/06/22   Olivia MackieWallace, Tiffany A, NP      Allergies    Nitrofurantoin monohyd macro and Sudafed [pseudoephedrine hcl]    Review of Systems   Review of Systems  Physical Exam Updated Vital Signs BP (!) 156/110 (BP Location: Right Arm)   Pulse 60   Temp 98.5 F (36.9 C) (Oral)   Resp 19   Ht 5' 7.5" (1.715  m)   Wt 90.7 kg   LMP 03/09/2022 Comment: had a cycle for 30 days  SpO2 98%   BMI 30.86 kg/m  Physical Exam Vitals and nursing note reviewed.  Constitutional:      General: She is not in acute distress.    Appearance: She is well-developed. She is not diaphoretic.  HENT:     Head: Normocephalic and atraumatic.  Eyes:     Pupils: Pupils are equal, round, and reactive to light.  Cardiovascular:     Rate and Rhythm: Normal rate and regular rhythm.     Heart sounds: No murmur heard.    No friction rub. No gallop.  Pulmonary:     Effort: Pulmonary effort is normal.     Breath sounds: No wheezing or rales.  Abdominal:     General: There is no distension.     Palpations: Abdomen is soft.     Tenderness: There is no abdominal tenderness.  Musculoskeletal:        General: No tenderness.     Cervical back: Normal range of motion and neck supple.  Skin:    General: Skin is warm and dry.          Comments:  Area of erythema that is slightly raised.  Blanching.  No pain at the medial or lateral epicondyles of the elbow.  Able to supinate pronate without issue.  No obvious edema to the left upper extremity otherwise.  Neurological:     Mental Status: She is alert and oriented to person, place, and time.  Psychiatric:        Behavior: Behavior normal.     ED Results / Procedures / Treatments   Labs (all labs ordered are listed, but only abnormal results are displayed) Labs Reviewed - No data to display  EKG None  Radiology US Venous Img Upper Uni Left  Result Date: 04/28/2022 CLINICAL DATA:  Arm pain EXAM: LEFT UPPER EXTREMITY VENOUS DOPPLER ULTRASOUND TECHNIQUE: Gray-scale sonography with graded compression, as well as color Doppler and duplex ultrasound were performed to evaluate the upper extremity deep venous system from the level of the subclavian vein and including the jugular, axillary, basilic, radial, ulnar and upper cephalic vein. Spectral Doppler was utilized to evaluate  flow at rest and with distal augmentation maneuvers. COMPARISON:  None Available. FINDINGS: Contralateral Subclavian Vein: Respiratory phasicity is normal and symmetric with the symptomatic side. No evidence of thrombus. Normal compressibility. Internal Jugular Vein: No evidence of thrombus. Normal compressibility, respiratory phasicity and response to augmentation. Subclavian Vein: No evidence of thrombus. Normal compressibility, respiratory phasicity and response to augmentation. Axillary Vein: No evidence of thrombus. Normal compressibility, respiratory phasicity and response to augmentation. Cephalic Vein: No evidence of thrombus. Normal compressibility, respiratory phasicity and response to augmentation. Basilic Vein: No evidence of thrombus. Normal compressibility, respiratory phasicity and response to augmentation. Brachial Veins: No evidence of thrombus. Normal compressibility, respiratory phasicity and response to augmentation. Radial Veins: No evidence of thrombus. Normal compressibility, respiratory phasicity and response to augmentation. Ulnar Veins: No evidence of thrombus. Normal compressibility, respiratory phasicity and response to augmentation. Venous Reflux:  None visualized. Other Findings:  None visualized. IMPRESSION: No evidence of DVT within the left upper extremity. Electronically Signed   By: Layla Maw M.D.   On: 04/28/2022 10:54    Procedures Procedures    Medications Ordered in ED Medications - No data to display  ED Course/ Medical Decision Making/ A&P                             Medical Decision Making Risk Prescription drug management.   52 yo F with a chief complaints of a change to her skin of her left arm.  On exam it looks more like an insect sting or perhaps cellulitis or may be contact dermatitis.  She denies any obvious plant or insect exposure.  There are no other lesions there is no blistering to suggest shingles.  I will treat for possible cellulitis.   She was more concerned about a DVT.  She had an ultrasound performed in triage that was negative.  Will have her follow-up with her family doctor in the office.  11:36 AM:  I have discussed the diagnosis/risks/treatment options with the patient.  Evaluation and diagnostic testing in the emergency department does not suggest an emergent condition requiring admission or immediate intervention beyond what has been performed at this time.  They will follow up with PCP. We also discussed returning to the ED immediately if new or worsening sx occur. We discussed the sx which are most concerning (e.g., sudden worsening pain, fever, inability to tolerate by mouth) that necessitate immediate return. Medications administered to  the patient during their visit and any new prescriptions provided to the patient are listed below.  Medications given during this visit Medications - No data to display   The patient appears reasonably screen and/or stabilized for discharge and I doubt any other medical condition or other [facility] requiring further screening, evaluation, or treatment in the ED at this time prior to discharge.          Final Clinical Impression(s) / ED Diagnoses Final diagnoses:  Cellulitis of right upper extremity    Rx / DC Orders ED Discharge Orders          Ordered    cephALEXin (KEFLEX) 500 MG capsule        04/28/22 1125              Melene PlanFloyd, Jmichael Gille, DO 04/28/22 1136

## 2022-05-18 DIAGNOSIS — I809 Phlebitis and thrombophlebitis of unspecified site: Secondary | ICD-10-CM | POA: Diagnosis not present

## 2022-05-18 DIAGNOSIS — M7989 Other specified soft tissue disorders: Secondary | ICD-10-CM | POA: Diagnosis not present

## 2022-05-22 ENCOUNTER — Other Ambulatory Visit (HOSPITAL_COMMUNITY): Payer: Self-pay | Admitting: Family Medicine

## 2022-05-22 ENCOUNTER — Ambulatory Visit (HOSPITAL_COMMUNITY)
Admission: RE | Admit: 2022-05-22 | Discharge: 2022-05-22 | Disposition: A | Discharge status: Home / Self Care | Payer: BC Managed Care – PPO | Source: Ambulatory Visit | Attending: Surgery | Admitting: Surgery

## 2022-05-22 DIAGNOSIS — M7989 Other specified soft tissue disorders: Secondary | ICD-10-CM | POA: Diagnosis not present

## 2022-05-25 ENCOUNTER — Encounter: Payer: Self-pay | Admitting: Nurse Practitioner

## 2022-05-25 ENCOUNTER — Ambulatory Visit (INDEPENDENT_AMBULATORY_CARE_PROVIDER_SITE_OTHER): Payer: BC Managed Care – PPO | Admitting: Nurse Practitioner

## 2022-05-25 VITALS — BP 132/80 | Ht 67.25 in | Wt 202.0 lb

## 2022-05-25 DIAGNOSIS — Z01419 Encounter for gynecological examination (general) (routine) without abnormal findings: Secondary | ICD-10-CM

## 2022-05-25 DIAGNOSIS — N921 Excessive and frequent menstruation with irregular cycle: Secondary | ICD-10-CM

## 2022-05-25 MED ORDER — NORETHINDRONE 0.35 MG PO TABS
1.0000 | ORAL_TABLET | Freq: Every day | ORAL | 3 refills | Status: AC
Start: 2022-05-25 — End: ?

## 2022-05-25 NOTE — Progress Notes (Addendum)
   Samantha Moon 1970/09/03 [id number]   History:  52 y.o. [id number] presents for annual exam. History of menorrhagia. Restarted mini pill in March due to irregular, prolonged menses. No bleeding since Normal pap and mammogram history.  History of DVT.  Being evaluated for LEE superficial thrombosis. Saw vascular specialist Monday. Pre-diabetes, HTN managed by PCP.   Gynecologic History Patient's last menstrual period was 03/11/2022 (exact date). Period Pattern: (!) Irregular Menstrual Control: Maxi pad, Tampon Dysmenorrhea: (!) Mild Dysmenorrhea Symptoms: Cramping Contraception/Family planning: tubal ligation Sexually active: Yes  Health Maintenance Last Pap: 05/20/2020. Results were: Normal ne HPV, 5-year repeat Last mammogram: 10/07/2020. Results were: Normal Last colonoscopy: 05/2021. 3-year recall Last Dexa: Never  Past medical history, past surgical history, family history and social history were all reviewed and documented in the EPIC chart. 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   ROS:  A ROS was performed and pertinent positives and negatives are included.  Exam:  Vitals:   05/25/22 1105  BP: 132/80  Weight: 202 lb (91.6 kg)  Height: 5' 7.25" (1.708 m)     Body mass index is 31.4 kg/m.  General appearance:  Normal Thyroid:  Symmetrical, normal in size, without palpable masses or nodularity. Respiratory  Auscultation:  Clear without wheezing or rhonchi Cardiovascular  Auscultation:  Regular rate, without rubs, murmurs or gallops  Edema/varicosities:  Not grossly evident Abdominal  Soft,nontender, without masses, guarding or rebound.  Liver/spleen:  No organomegaly noted  Hernia:  None appreciated  Skin  Inspection:  Grossly normal Breasts: Examined lying and sitting.   Right: Without masses, retractions, nipple discharge or axillary adenopathy.   Left: Without masses,  retractions, nipple discharge or axillary adenopathy. Gentitourinary   Inguinal/mons:  Normal without inguinal adenopathy  External genitalia:  Normal appearing vulva with no masses, tenderness, or lesions  BUS/Urethra/Skene's glands:  Normal  Vagina:  Normal appearing with normal color and discharge, no lesions  Cervix:  Normal appearing without discharge or lesions  Uterus:  Normal in size, shape and contour.  Midline and mobile, nontender  Adnexa/parametria:     Rt: Normal in size, without masses or tenderness.   Lt: Normal in size, without masses or tenderness.  Anus and perineum: Normal  Digital rectal exam: Deferred  Patient informed chaperone available to be present for breast and pelvic exam. Patient has requested no chaperone to be present. Patient has been advised what will be completed during breast and pelvic exam.   Assessment/Plan:  52 y.o. [name] for annual exam.   Well female exam with routine gynecological exam - Education provided on SBEs, importance of preventative screenings, current guidelines, high calcium diet, regular exercise, and multivitamin daily.  Labs with PCP.   Menorrhagia with irregular cycle - Plan: norethindrone (MICRONOR) 0.35 MG tablet daily. Restarted in March. No bleeding since. Likely perimenopausal.   Screening for cervical cancer - Normal Pap history.  Will repeat at 5-year interval per guidelines.    Screening for breast cancer - Normal mammogram history. Overdue and plans to schedule soon.  Normal breast exam today.  Screening for colon cancer - 05/2021 colonoscopy. Will repeat at GI's recommended interval.   Return in 1 year for annual.         Olivia Mackie DNP, 11:27 AM 05/25/2022

## 2022-05-30 ENCOUNTER — Ambulatory Visit (INDEPENDENT_AMBULATORY_CARE_PROVIDER_SITE_OTHER): Payer: BC Managed Care – PPO | Admitting: Vascular Surgery

## 2022-05-30 ENCOUNTER — Encounter: Payer: Self-pay | Admitting: Vascular Surgery

## 2022-05-30 VITALS — BP 148/87 | HR 63 | Temp 97.8°F | Resp 16 | Ht 67.5 in | Wt 203.0 lb

## 2022-05-30 DIAGNOSIS — I82622 Acute embolism and thrombosis of deep veins of left upper extremity: Secondary | ICD-10-CM | POA: Diagnosis not present

## 2022-05-30 MED ORDER — RIVAROXABAN (XARELTO) VTE STARTER PACK (15 & 20 MG): ORAL_TABLET | ORAL | 0 refills | Status: DC

## 2022-05-30 MED ORDER — RIVAROXABAN 20 MG PO TABS: 20.0000 mg | ORAL_TABLET | Freq: Every day | ORAL | 5 refills | Status: DC

## 2022-05-30 NOTE — Progress Notes (Signed)
Patient name: Samantha Moon MRN: [id number] DOB: Jul 29, 1970 Sex: female  REASON FOR CONSULT: Left upper extremity DVT  HPI: Samantha Moon is a 52 y.o. female, that presents for evaluation of left upper extremity DVT.  She states in early April she started developing swelling and pain in her left arm.  She was initially seen in the ED and had a negative venous duplex study.  She then had a second study on 05/22/2022 showing age-indeterminate DVT in the innominate vein on the left with nonocclusive thrombus.  There was also some thrombus in the basilic vein.  She denies any previous known history of left arm DVT.  She has had a right leg DVT in 2019.  States she was on Xarelto for about 2 years.  She was followed by hem-onc at [facility] long by Dr. Candise Che.  His last notes suggest that there was concern for protein S activity but this improved and was probably borderline normal in the setting of her hormonal variation.  Her repeat lupus anticoagulatant was negative suggesting that this was not an active thrombotic risk factor.  She has been taking heavy dose aspirin.  Past Medical History:  Diagnosis Date   Abnormal uterine bleeding (AUB)    Anemia    DVT (deep venous thrombosis) (HCC) 2019   x 2 one in each leg at different times   Heart murmur    echo done 09-05-2019   Hx gestational diabetes    Hypertension     Past Surgical History:  Procedure Laterality Date   DILATATION & CURETTAGE/HYSTEROSCOPY WITH MYOSURE N/A 10/28/2019   Procedure: DILATATION & CURETTAGE/HYSTEROSCOPY;  Surgeon: Theresia Majors, MD;  Location: [facility];  Service: Gynecology;  Laterality: N/A;  request 7:30am OR time in [place] Gyn block requests 30 minutes   HERNIA REPAIR  1991   inguinal   TUBAL LIGATION  2000   BTL    Family History  Problem Relation Age of Onset   Hypertension Mother    Stroke Mother    Hypertension Father    Hypertension Brother    Breast cancer Paternal Aunt      SOCIAL HISTORY: Social History   Socioeconomic History   Marital status: Married    Spouse name: Not on file   Number of children: Not on file   Years of education: Not on file   Highest education level: Not on file  Occupational History   Not on file  Tobacco Use   Smoking status: Never    Passive exposure: Never   Smokeless tobacco: Never  Vaping Use   Vaping Use: Never used  Substance and Sexual Activity   Alcohol use: Yes    Comment: occasional   Drug use: No   Sexual activity: Yes    Partners: Male    Birth control/protection: Surgical    Comment: TUBAL LIGATION,declined insurance questions,  Other Topics Concern   Not on file  Social History Narrative   Not on file   Social Determinants of Health   Financial Resource Strain: Not on file  Food Insecurity: Not on file  Transportation Needs: Not on file  Physical Activity: Not on file  Stress: Not on file  Social Connections: Not on file  Intimate Partner Violence: Not on file    Allergies  Allergen Reactions   Nitrofurantoin Monohyd Macro     MIGRAINES   Sudafed [Pseudoephedrine Hcl] Other (See Comments)    delirious    Current Outpatient Medications  Medication  Sig Dispense Refill   amLODipine-benazepril (LOTREL) 5-10 MG capsule Take 1 capsule by mouth daily.     aspirin 325 MG tablet Take 325 mg by mouth daily.     cephALEXin (KEFLEX) 500 MG capsule 2 caps po bid x 7 days 28 capsule 0   cetirizine (ZYRTEC) 10 MG tablet Take 10 mg by mouth daily. Reported on 05/05/2015     clonazePAM (KLONOPIN) 0.5 MG tablet Take 0.5 mg by mouth 2 (two) times daily as needed for anxiety. Reported on 05/05/2015     JARDIANCE 10 MG TABS tablet Take 10 mg by mouth every morning.     norethindrone (MICRONOR) 0.35 MG tablet Take 1 tablet (0.35 mg total) by mouth daily. 84 tablet 3   triamcinolone cream (KENALOG) 0.1 % Apply topically 2 (two) times daily as needed.     ferrous sulfate 325 (65 FE) MG EC tablet Take 1  tablet (325 mg total) by mouth 2 (two) times daily with a meal. 120 tablet 0   No current facility-administered medications for this visit.    REVIEW OF SYSTEMS:  [X]  denotes positive finding, [ ]  denotes negative finding Cardiac  Comments:  Chest pain or chest pressure:    Shortness of breath upon exertion:    Short of breath when lying flat:    Irregular heart rhythm:        Vascular    Pain in calf, thigh, or hip brought on by ambulation:    Pain in feet at night that wakes you up from your sleep:     Blood clot in your veins:    Leg swelling:     Arm swelling x Left  Pulmonary    Oxygen at home:    Productive cough:     Wheezing:         Neurologic    Sudden weakness in arms or legs:     Sudden numbness in arms or legs:     Sudden onset of difficulty speaking or slurred speech:    Temporary loss of vision in one eye:     Problems with dizziness:         Gastrointestinal    Blood in stool:     Vomited blood:         Genitourinary    Burning when urinating:     Blood in urine:        Psychiatric    Major depression:         Hematologic    Bleeding problems:    Problems with blood clotting too easily:        Skin    Rashes or ulcers:        Constitutional    Fever or chills:      PHYSICAL EXAM: Vitals:   05/30/22 1036  BP: (!) 148/87  Pulse: 63  Resp: 16  Temp: 97.8 F (36.6 C)  TempSrc: Temporal  SpO2: 96%  Weight: 203 lb (92.1 kg)  Height: 5' 7.5" (1.715 m)    GENERAL: The patient is a well-nourished female, in no acute distress. The vital signs are documented above. CARDIAC: There is a regular rate and rhythm.  VASCULAR:  Bilateral upper extremity radial pulses palpable Notable bruising to the left forearm PULMONARY: No respiratory distress. ABDOMEN: Soft and non-tender. MUSCULOSKELETAL: There are no major deformities or cyanosis. NEUROLOGIC: No focal weakness or paresthesias are detected. PSYCHIATRIC: The patient has a normal  affect.  DATA:   Left upper extremity duplex study  05/22/2022 shows left innominate vein dilation with nonocclusive thrombus  Assessment/Plan:  52 year old female presents for evaluation of left upper extremity DVT.  Her duplex study on 05/22/22 indeed shows evidence of nonocclusive thrombus in the left innominate vein.  She denies any prior history of upper extremity DVTs.  She has no chest wall implants or other central venous catheters that would be risk factors.  The left upper extremity venous duplex findings correlate with her symptom onset early last month when she developed left arm pain and swelling and suggests the DVT likely at least subacute in nature.  She has had a right leg DVT in 2019.  I have recommended that she restart Xarelto and I have given her prescription.  This is a second thromboembolic event for her and discussed often requires lifelong anticoagulation.  I have will have her follow-up with Dr. Candise Che with hematology who has followed her in the past for extensive work-up.   She has previously had extensive workup as noted in the hematology notes and HPI above.  Discussed there is no indication for surgical intervention on her nonocclusive thrombus. Her arm does not appear significantly swollen today.   Cephus Shelling, MD [facility] Office: [phone]

## 2022-06-08 DIAGNOSIS — R7309 Other abnormal glucose: Secondary | ICD-10-CM | POA: Diagnosis not present

## 2022-06-08 DIAGNOSIS — N1831 Chronic kidney disease, stage 3a: Secondary | ICD-10-CM | POA: Diagnosis not present

## 2022-06-08 DIAGNOSIS — I10 Essential (primary) hypertension: Secondary | ICD-10-CM | POA: Diagnosis not present

## 2022-06-08 DIAGNOSIS — E041 Nontoxic single thyroid nodule: Secondary | ICD-10-CM | POA: Diagnosis not present

## 2022-06-15 DIAGNOSIS — M1711 Unilateral primary osteoarthritis, right knee: Secondary | ICD-10-CM | POA: Diagnosis not present

## 2022-06-27 ENCOUNTER — Other Ambulatory Visit: Payer: Self-pay | Admitting: Nurse Practitioner

## 2022-06-27 DIAGNOSIS — Z1231 Encounter for screening mammogram for malignant neoplasm of breast: Secondary | ICD-10-CM

## 2022-07-07 ENCOUNTER — Inpatient Hospital Stay: Payer: BC Managed Care – PPO

## 2022-07-07 ENCOUNTER — Inpatient Hospital Stay: Payer: BC Managed Care – PPO | Attending: Hematology | Admitting: Hematology

## 2022-07-07 ENCOUNTER — Other Ambulatory Visit: Payer: Self-pay

## 2022-07-07 VITALS — BP 125/74 | HR 64 | Temp 97.9°F | Resp 18 | Ht 67.5 in | Wt 207.3 lb

## 2022-07-07 DIAGNOSIS — Z79899 Other long term (current) drug therapy: Secondary | ICD-10-CM | POA: Insufficient documentation

## 2022-07-07 DIAGNOSIS — Z7901 Long term (current) use of anticoagulants: Secondary | ICD-10-CM | POA: Insufficient documentation

## 2022-07-07 DIAGNOSIS — Z7982 Long term (current) use of aspirin: Secondary | ICD-10-CM | POA: Insufficient documentation

## 2022-07-07 DIAGNOSIS — M25472 Effusion, left ankle: Secondary | ICD-10-CM | POA: Insufficient documentation

## 2022-07-07 DIAGNOSIS — D6859 Other primary thrombophilia: Secondary | ICD-10-CM

## 2022-07-07 DIAGNOSIS — D500 Iron deficiency anemia secondary to blood loss (chronic): Secondary | ICD-10-CM | POA: Diagnosis not present

## 2022-07-07 DIAGNOSIS — Z803 Family history of malignant neoplasm of breast: Secondary | ICD-10-CM | POA: Insufficient documentation

## 2022-07-07 DIAGNOSIS — Z86718 Personal history of other venous thrombosis and embolism: Secondary | ICD-10-CM | POA: Diagnosis not present

## 2022-07-07 DIAGNOSIS — I82622 Acute embolism and thrombosis of deep veins of left upper extremity: Secondary | ICD-10-CM

## 2022-07-07 DIAGNOSIS — M25471 Effusion, right ankle: Secondary | ICD-10-CM | POA: Diagnosis not present

## 2022-07-07 DIAGNOSIS — R319 Hematuria, unspecified: Secondary | ICD-10-CM | POA: Diagnosis not present

## 2022-07-07 DIAGNOSIS — N920 Excessive and frequent menstruation with regular cycle: Secondary | ICD-10-CM | POA: Insufficient documentation

## 2022-07-07 LAB — CBC WITH DIFFERENTIAL (CANCER CENTER ONLY)
Abs Immature Granulocytes: 0.01 10*3/uL (ref 0.00–0.07)
Basophils Absolute: 0 10*3/uL (ref 0.0–0.1)
Basophils Relative: 0 %
Eosinophils Absolute: 0.1 10*3/uL (ref 0.0–0.5)
Eosinophils Relative: 2 %
HCT: 40 % (ref 36.0–46.0)
Hemoglobin: 13.4 g/dL (ref 12.0–15.0)
Immature Granulocytes: 0 %
Lymphocytes Relative: 38 %
Lymphs Abs: 1.7 10*3/uL (ref 0.7–4.0)
MCH: 31.2 pg (ref 26.0–34.0)
MCHC: 33.5 g/dL (ref 30.0–36.0)
MCV: 93.2 fL (ref 80.0–100.0)
Monocytes Absolute: 0.3 10*3/uL (ref 0.1–1.0)
Monocytes Relative: 7 %
Neutro Abs: 2.4 10*3/uL (ref 1.7–7.7)
Neutrophils Relative %: 53 %
Platelet Count: 326 10*3/uL (ref 150–400)
RBC: 4.29 MIL/uL (ref 3.87–5.11)
RDW: 13.2 % (ref 11.5–15.5)
WBC Count: 4.6 10*3/uL (ref 4.0–10.5)
nRBC: 0 % (ref 0.0–0.2)

## 2022-07-07 LAB — CMP (CANCER CENTER ONLY)
ALT: 24 U/L (ref 0–44)
AST: 40 U/L (ref 15–41)
Albumin: 3.8 g/dL (ref 3.5–5.0)
Alkaline Phosphatase: 72 U/L (ref 38–126)
Anion gap: 6 (ref 5–15)
BUN: 16 mg/dL (ref 6–20)
CO2: 28 mmol/L (ref 22–32)
Calcium: 9.2 mg/dL (ref 8.9–10.3)
Chloride: 107 mmol/L (ref 98–111)
Creatinine: 1.14 mg/dL — ABNORMAL HIGH (ref 0.44–1.00)
GFR, Estimated: 58 mL/min — ABNORMAL LOW (ref >60)
Glucose, Bld: 80 mg/dL (ref 70–99)
Potassium: 4 mmol/L (ref 3.5–5.1)
Sodium: 141 mmol/L (ref 135–145)
Total Bilirubin: 0.4 mg/dL (ref 0.3–1.2)
Total Protein: 7.5 g/dL (ref 6.5–8.1)

## 2022-07-07 LAB — FERRITIN: Ferritin: 20 ng/mL (ref 11–307)

## 2022-07-07 LAB — IRON AND IRON BINDING CAPACITY (CC-WL,HP ONLY)
Iron: 101 ug/dL (ref 28–170)
Saturation Ratios: 26 % (ref 10.4–31.8)
TIBC: 395 ug/dL (ref 250–450)
UIBC: 294 ug/dL (ref 148–442)

## 2022-07-07 NOTE — Progress Notes (Signed)
HEMATOLOGY/ONCOLOGY CLINIC NOTE  Date of Service: 07/07/22    Patient Care Team: Sigmund Hazel, MD as PCP - General (Family Medicine) Olivia Mackie, NP as Nurse Practitioner (Gynecology)  CHIEF COMPLAINTS/PURPOSE OF CONSULTATION:  History of DVT   HISTORY OF PRESENTING ILLNESS:   Samantha Moon is a wonderful 52 y.o. female who has been referred to [place] by Dr Juluis Rainier for evaluation and management of history of DVT. The pt reports that she is doing well overall.   The pt reports having a DVT in her right leg in late February and was started on Xarelto with initial 3 week loading dose.. This was her first blood clot. She notes that her brother and her father both had blood clots, though her father's was related to an automobile accident. Her brother's clot was in the leg and was in the setting of obesity and hip arthritis.    She notes that a week before being diagnosed with her DVT on 03/21/17, she noticed swelling in her right leg. She then developed significant pain that felt like a charlie horse, and her leg became warm to the touch. She went to her doctor and was worked up with a Rt leg [place]. She notes that she sits for 10-12 hours a day due to her job. She notes that she took hormone based birth control 20 years ago, for 12 years. She has been pregnant 3 times, and has two children without any complications or clotting abnormalities. She has also had two hernia surgeries. She notes that her BP has been higher than normal in the past year, and she has begun amlodipine. She takes Zyrtec and Benadryl as needed for seasonal allergies.   She has seen a urologist at [facility] in January for hematuria as picked up by a urine test, and has been worked up with a CT. She is continuing follow up and denies flank pains. She notes that the hematuria was picked up before beginning Xarelto.   She notes that her pain and much of her leg swelling has since resolved, but she has some  residual leg swelling.   She notes that since beginning Xarelto, her periods have been very heavy. She notes that the length of her periods has not changed from her normal 6-7 days.   Of note prior to the patient's visit today, pt has had Rt leg [place] completed on 03/21/17 with results revealing Deep venous thrombosis in the right popliteal vein and calf veins.   Most recent lab results (04/27/17) of CBC  is as follows: all values are WNL except for RBC at 3.82, Hgb at 11.9, HCT at 35.6.   On review of systems, pt reports stable weight, residual but decreased right leg swelling, heavy periods, and denies chest pain, SOB, DOE, skin rashes, visible hematuria, and any other symptoms.   On PMHx the pt reports 2 hernia surgeries, DVT 03/21/17 and denies any other history of blood clots. On Social Hx the pt denies smoking ever.  On Family Hx the pt reports blood clots in father and brother. She denies any blood disorders.   Interval History:   Samantha Moon returns today for management and evaluation of her history of acute DVT. The patient's last visit with [place] was on 06/10/17 and she reported baseline bilateral ankle swelling, blood in the urine, and heavy menstrual cycles.  Today, she is accompanied by her husband. She confirms that she did have a previous blood clot in her  popliteal vein of her right calf.   Patient has since had a second blood clot in her left upper extremity. She reports that she experienced her second blood clot while still on Aspirin.   She reports that in April 2024, she experienced initially endorsed mild pain in her left upper extremity with redness. She denies any injuries in the area, or engaging unusual activities at the time. A few days later, she noticed swelling in the area with warmth and suspected that she had a blood clot. She denies any full-arm swelling. She denies any  recent IV lines, blood drawn in the area, or tight bands.   Patient has since been on Xarelto. She  reports that her swelling has improved since being on blood thinners. Patient does note one mild bruise. She denies any bleeding issues while on blood thinners.   She denies any long distance travel, recent surgeries. 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 She does stay regularly hydrated.   She confirms that she was iron deficient from previous heavy menstrual cycles. She is been on systemic progesterone due to irregular periods, She has been on this for about 90 days. She has not had any menstrual cycles in 3 months since being on systemic progesterone. She has 2 weeks of medication remaining at this time.   She reports she was given the option of having an IUD implanted by her OB/GYN. Her OB/GYN did recommend patient to stay on progesterone for 1 year.   She denies any unexplained weight loss, fever, chills, night sweats, abdominal pain, change in bowel habits, leg swelling, pain along the spine.   She reports a previous large red spot in her right posterior lower extremity. Patient applied warm compresses to the area and it eventually resolved.   She has received a recent colonoscopy, but has not had a recent mammogram. She denies any new breast symptoms.   MEDICAL HISTORY:  Past Medical History:  Diagnosis Date   Abnormal uterine bleeding (AUB)    Anemia    DVT (deep venous thrombosis) (HCC) 2019   x 2 one in each leg at different times   Heart murmur    echo done 09-05-2019   Hx gestational diabetes    Hypertension     SURGICAL HISTORY: Past Surgical History:  Procedure Laterality Date   DILATATION & CURETTAGE/HYSTEROSCOPY WITH MYOSURE N/A 10/28/2019   Procedure: DILATATION & CURETTAGE/HYSTEROSCOPY;  Surgeon: Theresia Majors, MD;  Location: [facility];  Service: Gynecology;  Laterality: N/A;  request 7:30am OR time in [place] Gyn block requests 30 minutes   HERNIA REPAIR  1991   inguinal   TUBAL LIGATION  2000   BTL    SOCIAL HISTORY: Social  History   Socioeconomic History   Marital status: Married    Spouse name: Not on file   Number of children: Not on file   Years of education: Not on file   Highest education level: Not on file  Occupational History   Not on file  Tobacco Use   Smoking status: Never    Passive exposure: Never   Smokeless tobacco: Never  Vaping Use   Vaping Use: Never used  Substance and Sexual Activity   Alcohol use: Yes    Comment: occasional   Drug use: No   Sexual activity: Yes    Partners: Male    Birth control/protection: Surgical    Comment: TUBAL LIGATION,declined insurance questions,  Other Topics Concern   Not on file  Social History Narrative   Not on file   Social Determinants of Health   Financial Resource Strain: Not on file  Food Insecurity: Not on file  Transportation Needs: Not on file  Physical Activity: Not on file  Stress: Not on file  Social Connections: Not on file  Intimate Partner Violence: Not on file    FAMILY HISTORY: Family History  Problem Relation Age of Onset   Hypertension Mother    Stroke Mother    Hypertension Father    Hypertension Brother    Breast cancer Paternal Aunt     ALLERGIES:  is allergic to nitrofurantoin monohyd macro and sudafed [pseudoephedrine hcl].  MEDICATIONS:  Current Outpatient Medications  Medication Sig Dispense Refill   amLODipine-benazepril (LOTREL) 5-10 MG capsule Take 1 capsule by mouth daily.     aspirin 325 MG tablet Take 325 mg by mouth daily.     cephALEXin (KEFLEX) 500 MG capsule 2 caps po bid x 7 days 28 capsule 0   cetirizine (ZYRTEC) 10 MG tablet Take 10 mg by mouth daily. Reported on 05/05/2015     clonazePAM (KLONOPIN) 0.5 MG tablet Take 0.5 mg by mouth 2 (two) times daily as needed for anxiety. Reported on 05/05/2015     ferrous sulfate 325 (65 FE) MG EC tablet Take 1 tablet (325 mg total) by mouth 2 (two) times daily with a meal. 120 tablet 0   JARDIANCE 10 MG TABS tablet Take 10 mg by mouth every morning.      norethindrone (MICRONOR) 0.35 MG tablet Take 1 tablet (0.35 mg total) by mouth daily. 84 tablet 3   rivaroxaban (XARELTO) 20 MG TABS tablet Take 1 tablet (20 mg total) by mouth daily with supper. 30 tablet 5   RIVAROXABAN (XARELTO) VTE STARTER PACK (15 & 20 MG) Follow package directions: Take one 15mg  tablet by mouth twice a day. On day 22, switch to one 20mg  tablet once a day. Take with food. 51 each 0   triamcinolone cream (KENALOG) 0.1 % Apply topically 2 (two) times daily as needed.     No current facility-administered medications for this visit.    REVIEW OF SYSTEMS:    10 Point review of Systems was done is negative except as noted above.   PHYSICAL EXAMINATION:  Vitals:   07/07/22 1426  BP: 125/74  Pulse: 64  Resp: 18  Temp: 97.9 F (36.6 C)  SpO2: 99%    Filed Weights   07/07/22 1426  Weight: 207 lb 5 oz (94 kg)    .Body mass index is 31.99 kg/m.   GENERAL:alert, in no acute distress and comfortable SKIN: no acute rashes, no significant lesions EYES: conjunctiva are pink and non-injected, sclera anicteric OROPHARYNX: MMM, no exudates, no oropharyngeal erythema or ulceration NECK: supple, no JVD LYMPH:  no palpable lymphadenopathy in the cervical, axillary or inguinal regions LUNGS: clear to auscultation b/l with normal respiratory effort HEART: regular rate & rhythm ABDOMEN:  normoactive bowel sounds , non tender, not distended. Extremity: no pedal edema PSYCH: alert & oriented x 3 with fluent speech NEURO: no focal motor/sensory deficits   LABORATORY DATA:  I have reviewed the data as listed  .    Latest Ref Rng & Units 07/07/2022    3:10 PM 11/24/2020   10:08 AM 05/20/2020   11:31 AM  CBC  WBC 4.0 - 10.5 K/uL 4.6  6.0  5.7   Hemoglobin 12.0 - 15.0 g/dL 81.1  91.4  78.2   Hematocrit 36.0 -  46.0 % 40.0  41.6  37.7   Platelets 150 - 400 K/uL 326  373  348    . CBC    Component Value Date/Time   WBC 4.6 07/07/2022 1510   WBC 6.0 11/24/2020  1008   RBC 4.29 07/07/2022 1510   HGB 13.4 07/07/2022 1510   HCT 40.0 07/07/2022 1510   PLT 326 07/07/2022 1510   MCV 93.2 07/07/2022 1510   MCH 31.2 07/07/2022 1510   MCHC 33.5 07/07/2022 1510   RDW 13.2 07/07/2022 1510   LYMPHSABS 1.7 07/07/2022 1510   MONOABS 0.3 07/07/2022 1510   EOSABS 0.1 07/07/2022 1510   BASOSABS 0.0 07/07/2022 1510    .    Latest Ref Rng & Units 07/07/2022    3:10 PM 05/20/2020   11:31 AM 10/28/2019    6:58 AM  CMP  Glucose 70 - 99 mg/dL 80  81    81  98   BUN 6 - 20 mg/dL 16  18    18  17    Creatinine 0.44 - 1.00 mg/dL 8.11  9.14    7.82  9.56   Sodium 135 - 145 mmol/L 141  140    140  142   Potassium 3.5 - 5.1 mmol/L 4.0  4.5    4.5  3.9   Chloride 98 - 111 mmol/L 107  104    104  104   CO2 22 - 32 mmol/L 28  28    28     Calcium 8.9 - 10.3 mg/dL 9.2  9.8    9.8    Total Protein 6.5 - 8.1 g/dL 7.5  7.7    7.7    Total Bilirubin 0.3 - 1.2 mg/dL 0.4  0.4    0.4    Alkaline Phos 38 - 126 U/L 72     AST 15 - 41 U/L 40  19    19    ALT 0 - 44 U/L 24  16    16      Factor 5 leiden  Order: [id number]  Status:  Final result   Visible to patient:  No (Not Released) Next appt:  None Dx:  Primary hypercoagulable state (HCC); ...  Component 12d ago  Recommendations-F5LEID: Comment   Comment: (NOTE)  Result:  Negative (no mutation found)        Prothrombin gene mutation  Order: [id number]  Status:  Final result   Visible to patient:  No (Not Released) Next appt:  None Dx:  Primary hypercoagulable state (HCC); ...  Component 12d ago  Recommendations-PTGENE: Comment   Comment: (NOTE)  NEGATIVE  No mutation identified.        Component     Latest Ref Rng & Units 06/07/2017  Beta-2 Glycoprotein I Ab, IgG     0 - 20 GPI IgG units <9  Beta-2-Glycoprotein I IgM     0 - 32 GPI IgM units <9  Beta-2-Glycoprotein I IgA     0 - 25 GPI IgA units <9  Anticardiolipin Ab,IgG,Qn     0 - 14 GPL U/mL <9  Anticardiolipin Ab,IgM,Qn     0 - 12 MPL U/mL  <9  Anticardiolipin Ab,IgA,Qn     0 - 11 APL U/mL <9  Protein S-Functional     63 - 140 % 58 (L)  Protein C-Functional     73 - 180 % 94    Lupus anticoagulant panel  Order: [id number]  Status:  Edited Result - FINAL   Visible  to patient:  No (Not Released) Next appt:  None Dx:  Primary hypercoagulable state (HCC); ...   Ref Range & Units 12d ago  PTT Lupus Anticoagulant 0.0 - 51.9 sec 33.2   DRVVT 0.0 - 47.0 sec 55.6High    Lupus Anticoag Interp  Comment: VC  Comment: (NOTE)  Results are consistent with the presence of a lupus anticoagulant.  NOTE: Only persistent lupus anticoagulants are thought to be of  clinical  significance         RADIOGRAPHIC STUDIES: I have personally reviewed the radiological images as listed and agreed with the findings in the report. No results found.  ASSESSMENT & PLAN:  52 y.o. female with  1. RLE acute DVT of popliteal and calf veins. ?primary trigger factor Contributing factors - obesity .Body mass index is 31.99 kg/m., prolonged sitting for 8-10 hrs per day.  2) Anemia -normocytic anemia Hgb 10.9.. Heavy periods.  3. LUE acute DVT of innominate vein on the left with nonocclusive thrombus and thrombus in the basilic vein.   PLAN:  -63% protein S level.  -Lupus anticoagulant initially false positive and upon repeat testing showed negative. -Discussed that the rest of extensive testing showed no clotting disorder -discussed results of 04/28/2022 US venous lmg upper Uni Left which showed normal findings -discussed that repeat US on 4/19 did show blood clot -recommend patient to take frequent breaks at work and to stay regularly hydrated to reduce risk of blood clots  -informed patient that sitting for long periods at a desk may be a risk factor for lower extremity blood clots but would not explain upper extremity blood clots -Advised patient to avoid estrogen-containing hormonal medications -would not recommend systemic Progesterone unless  absolutely necessary due to previous history of blood clots -would recommend long term blood thinners as second event of blood clot is not completely explained with a clear provoking event -difficult to know whether systemic progesterone explains blood clot -would recommend full dose of Xarelto at this time. Discussed that there is a possibility to reduce dose to a preventative dose in about 6 months -discussed that the determination of eventually cutting to a preventative dose of Xarelto would depend on whether there is any there risk factors for bleeding that may arise -answered all of patient's questions in detail  -will recheck protein S levels -Advised patient to stay UTD with age-appropriate cancer screenings including mammogram, colonoscopy, and pap smear  -will refill Xarelto -Advised patient to connect with OB/GYN to discuss alternative options to control irregular menstrual cycles as opposed to systemic Progesterone -continue to follow-up with PCP and OB/GYN regularly for optimal care -will order blood tests -4 month final decision on blood thinners  FOLLOW-UP: Labs today RTC with Dr Candise Che with labs in 4 months  The total time spent in the appointment was 45 minutes* .  All of the patient's questions were answered with apparent satisfaction. The patient knows to call the clinic with any problems, questions or concerns.   Wyvonnia Lora MD MS AAHIVMS [facility] Hematology/Oncology Physician [facility]  .*Total Encounter Time as defined by the Centers for Medicare and Medicaid Services includes, in addition to the face-to-face time of a patient visit (documented in the note above) non-face-to-face time: obtaining and reviewing outside history, ordering and reviewing medications, tests or procedures, care coordination (communications with other health care professionals or caregivers) and documentation in the medical record.    I,Mitra Faeizi,acting as a Neurosurgeon for Wyvonnia Lora, MD.,have documented  all relevant documentation on the behalf of Wyvonnia Lora, MD,as directed by  Wyvonnia Lora, MD while in the presence of Wyvonnia Lora, MD.  .I have reviewed the above documentation for accuracy and completeness, and I agree with the above. Johney Maine MD

## 2022-07-08 LAB — PROTEIN S PANEL
Protein S Activity: 67 % (ref 63–140)
Protein S Ag, Free: 98 % (ref 61–136)
Protein S Ag, Total: 106 % (ref 60–150)

## 2022-07-10 ENCOUNTER — Telehealth: Payer: Self-pay | Admitting: Hematology

## 2022-07-11 ENCOUNTER — Other Ambulatory Visit: Payer: Self-pay

## 2022-07-11 DIAGNOSIS — I82622 Acute embolism and thrombosis of deep veins of left upper extremity: Secondary | ICD-10-CM

## 2022-07-11 MED ORDER — RIVAROXABAN 20 MG PO TABS
20.0000 mg | ORAL_TABLET | Freq: Every day | ORAL | 0 refills | Status: DC
Start: 2022-07-11 — End: 2022-10-12

## 2022-09-02 DIAGNOSIS — R509 Fever, unspecified: Secondary | ICD-10-CM | POA: Diagnosis not present

## 2022-09-02 DIAGNOSIS — R051 Acute cough: Secondary | ICD-10-CM | POA: Diagnosis not present

## 2022-09-02 DIAGNOSIS — J029 Acute pharyngitis, unspecified: Secondary | ICD-10-CM | POA: Diagnosis not present

## 2022-09-06 ENCOUNTER — Ambulatory Visit
Admission: RE | Admit: 2022-09-06 | Discharge: 2022-09-06 | Disposition: A | Discharge status: Home / Self Care | Payer: BC Managed Care – PPO | Source: Ambulatory Visit | Attending: Nurse Practitioner | Admitting: Nurse Practitioner

## 2022-09-06 DIAGNOSIS — Z1231 Encounter for screening mammogram for malignant neoplasm of breast: Secondary | ICD-10-CM | POA: Diagnosis not present

## 2022-10-12 ENCOUNTER — Other Ambulatory Visit: Payer: Self-pay

## 2022-10-12 DIAGNOSIS — I82622 Acute embolism and thrombosis of deep veins of left upper extremity: Secondary | ICD-10-CM

## 2022-10-12 MED ORDER — RIVAROXABAN 20 MG PO TABS
20.0000 mg | ORAL_TABLET | Freq: Every day | ORAL | 0 refills | Status: DC
Start: 2022-10-12 — End: 2022-11-16

## 2022-11-02 DIAGNOSIS — H11442 Conjunctival cysts, left eye: Secondary | ICD-10-CM | POA: Diagnosis not present

## 2022-11-06 ENCOUNTER — Other Ambulatory Visit: Payer: Self-pay | Admitting: Nurse Practitioner

## 2022-11-06 DIAGNOSIS — E041 Nontoxic single thyroid nodule: Secondary | ICD-10-CM

## 2022-11-08 ENCOUNTER — Other Ambulatory Visit: Payer: Self-pay

## 2022-11-08 DIAGNOSIS — I82622 Acute embolism and thrombosis of deep veins of left upper extremity: Secondary | ICD-10-CM

## 2022-11-10 ENCOUNTER — Inpatient Hospital Stay (HOSPITAL_BASED_OUTPATIENT_CLINIC_OR_DEPARTMENT_OTHER): Payer: BC Managed Care – PPO | Admitting: Hematology

## 2022-11-10 ENCOUNTER — Inpatient Hospital Stay: Payer: BC Managed Care – PPO | Attending: Hematology

## 2022-11-10 ENCOUNTER — Other Ambulatory Visit: Payer: BC Managed Care – PPO

## 2022-11-10 ENCOUNTER — Ambulatory Visit: Payer: BC Managed Care – PPO | Admitting: Hematology

## 2022-11-10 VITALS — BP 139/79 | HR 79 | Temp 97.0°F | Resp 20 | Wt 212.1 lb

## 2022-11-10 DIAGNOSIS — Z79899 Other long term (current) drug therapy: Secondary | ICD-10-CM | POA: Insufficient documentation

## 2022-11-10 DIAGNOSIS — Z86718 Personal history of other venous thrombosis and embolism: Secondary | ICD-10-CM | POA: Insufficient documentation

## 2022-11-10 DIAGNOSIS — Z6832 Body mass index (BMI) 32.0-32.9, adult: Secondary | ICD-10-CM | POA: Insufficient documentation

## 2022-11-10 DIAGNOSIS — Z7901 Long term (current) use of anticoagulants: Secondary | ICD-10-CM | POA: Insufficient documentation

## 2022-11-10 DIAGNOSIS — D6859 Other primary thrombophilia: Secondary | ICD-10-CM | POA: Diagnosis not present

## 2022-11-10 DIAGNOSIS — D649 Anemia, unspecified: Secondary | ICD-10-CM | POA: Diagnosis not present

## 2022-11-10 DIAGNOSIS — I82622 Acute embolism and thrombosis of deep veins of left upper extremity: Secondary | ICD-10-CM

## 2022-11-10 LAB — CBC WITH DIFFERENTIAL (CANCER CENTER ONLY)
Abs Immature Granulocytes: 0 10*3/uL (ref 0.00–0.07)
Basophils Absolute: 0 10*3/uL (ref 0.0–0.1)
Basophils Relative: 1 %
Eosinophils Absolute: 0.2 10*3/uL (ref 0.0–0.5)
Eosinophils Relative: 4 %
HCT: 40.4 % (ref 36.0–46.0)
Hemoglobin: 13.6 g/dL (ref 12.0–15.0)
Immature Granulocytes: 0 %
Lymphocytes Relative: 47 %
Lymphs Abs: 2 10*3/uL (ref 0.7–4.0)
MCH: 31.3 pg (ref 26.0–34.0)
MCHC: 33.7 g/dL (ref 30.0–36.0)
MCV: 93.1 fL (ref 80.0–100.0)
Monocytes Absolute: 0.3 10*3/uL (ref 0.1–1.0)
Monocytes Relative: 8 %
Neutro Abs: 1.7 10*3/uL (ref 1.7–7.7)
Neutrophils Relative %: 40 %
Platelet Count: 306 10*3/uL (ref 150–400)
RBC: 4.34 MIL/uL (ref 3.87–5.11)
RDW: 12.6 % (ref 11.5–15.5)
WBC Count: 4.2 10*3/uL (ref 4.0–10.5)
nRBC: 0 % (ref 0.0–0.2)

## 2022-11-10 LAB — FERRITIN: Ferritin: 19 ng/mL (ref 11–307)

## 2022-11-10 LAB — IRON AND IRON BINDING CAPACITY (CC-WL,HP ONLY)
Iron: 69 ug/dL (ref 28–170)
Saturation Ratios: 18 % (ref 10.4–31.8)
TIBC: 378 ug/dL (ref 250–450)
UIBC: 309 ug/dL (ref 148–442)

## 2022-11-10 LAB — CMP (CANCER CENTER ONLY)
ALT: 18 U/L (ref 0–44)
AST: 23 U/L (ref 15–41)
Albumin: 3.9 g/dL (ref 3.5–5.0)
Alkaline Phosphatase: 68 U/L (ref 38–126)
Anion gap: 5 (ref 5–15)
BUN: 17 mg/dL (ref 6–20)
CO2: 30 mmol/L (ref 22–32)
Calcium: 9.1 mg/dL (ref 8.9–10.3)
Chloride: 104 mmol/L (ref 98–111)
Creatinine: 1.15 mg/dL — ABNORMAL HIGH (ref 0.44–1.00)
GFR, Estimated: 57 mL/min — ABNORMAL LOW (ref >60)
Glucose, Bld: 93 mg/dL (ref 70–99)
Potassium: 4.1 mmol/L (ref 3.5–5.1)
Sodium: 139 mmol/L (ref 135–145)
Total Bilirubin: 0.4 mg/dL (ref 0.3–1.2)
Total Protein: 7.7 g/dL (ref 6.5–8.1)

## 2022-11-10 NOTE — Progress Notes (Signed)
HEMATOLOGY/ONCOLOGY CLINIC NOTE  Date of Service: 11/10/22    Patient Care Team: Sigmund Hazel, MD as PCP - General (Family Medicine) Olivia Mackie, NP as Nurse Practitioner (Gynecology)  CHIEF COMPLAINTS/PURPOSE OF CONSULTATION:  History of DVT   HISTORY OF PRESENTING ILLNESS:   Samantha Moon is a wonderful 52 y.o. female who has been referred to [place] by Dr Juluis Rainier for evaluation and management of history of DVT. The pt reports that she is doing well overall.   The pt reports having a DVT in her right leg in late February and was started on Xarelto with initial 3 week loading dose.. This was her first blood clot. She notes that her brother and her father both had blood clots, though her father's was related to an automobile accident. Her brother's clot was in the leg and was in the setting of obesity and hip arthritis.    She notes that a week before being diagnosed with her DVT on 03/21/17, she noticed swelling in her right leg. She then developed significant pain that felt like a charlie horse, and her leg became warm to the touch. She went to her doctor and was worked up with a Rt leg [place]. She notes that she sits for 10-12 hours a day due to her job. She notes that she took hormone based birth control 20 years ago, for 12 years. She has been pregnant 3 times, and has two children without any complications or clotting abnormalities. She has also had two hernia surgeries. She notes that her BP has been higher than normal in the past year, and she has begun amlodipine. She takes Zyrtec and Benadryl as needed for seasonal allergies.   She has seen a urologist at [facility] in January for hematuria as picked up by a urine test, and has been worked up with a CT. She is continuing follow up and denies flank pains. She notes that the hematuria was picked up before beginning Xarelto.   She notes that her pain and much of her leg swelling has since resolved, but she has some  residual leg swelling.   She notes that since beginning Xarelto, her periods have been very heavy. She notes that the length of her periods has not changed from her normal 6-7 days.   Of note prior to the patient's visit today, pt has had Rt leg [place] completed on 03/21/17 with results revealing Deep venous thrombosis in the right popliteal vein and calf veins.   Most recent lab results (04/27/17) of CBC  is as follows: all values are WNL except for RBC at 3.82, Hgb at 11.9, HCT at 35.6.   On review of systems, pt reports stable weight, residual but decreased right leg swelling, heavy periods, and denies chest pain, SOB, DOE, skin rashes, visible hematuria, and any other symptoms.   On PMHx the pt reports 2 hernia surgeries, DVT 03/21/17 and denies any other history of blood clots. On Social Hx the pt denies smoking ever.  On Family Hx the pt reports blood clots in father and brother. She denies any blood disorders.   Interval History:   Samantha Moon returns today for management and evaluation of her history of acute DVT. The patient's last visit with [place] was on 07/07/2022 and reported that she initially endorsed mild pain in her left upper extremity with redness and swelling, mild bruise, and irregular periods.  Today, she reports that she has been doing well over the last  4 months and denies any new medical concerns. Her left arm swelling has resolved and she has no upper extremity edema at this time. She denies any chest pain, SOB, sudden weight loss, new lumps/bumps, or changes in bowel/urinary habits. Her leg swelling is stable. She denies starting any new medications since her last visit.   Patient has been tolerating Xarelto with no new or severe toxicities.   She reports that she started having a menstrual cycle this week and she began having mildly heavier flow by day 3. She reports having a period in September which was fairly heavy and lasted 2 weeks. Patient does not use any systemic  Progesterone at this time. She reports that she is not considering any other form of contraception such as IUD with OB/GYN.   She reports that she does sit often during work, stays regularly stay hydrated, and denies any back pain.  MEDICAL HISTORY:  Past Medical History:  Diagnosis Date   Abnormal uterine bleeding (AUB)    Anemia    DVT (deep venous thrombosis) (HCC) 2019   x 2 one in each leg at different times   Heart murmur    echo done 09-05-2019   Hx gestational diabetes    Hypertension     SURGICAL HISTORY: Past Surgical History:  Procedure Laterality Date   DILATATION & CURETTAGE/HYSTEROSCOPY WITH MYOSURE N/A 10/28/2019   Procedure: DILATATION & CURETTAGE/HYSTEROSCOPY;  Surgeon: Theresia Majors, MD;  Location: [facility];  Service: Gynecology;  Laterality: N/A;  request 7:30am OR time in [place] [facility] block requests 30 minutes   HERNIA REPAIR  1991   inguinal   TUBAL LIGATION  2000   BTL    SOCIAL HISTORY: Social History   Socioeconomic History   Marital status: Married    Spouse name: Not on file   Number of children: Not on file   Years of education: Not on file   Highest education level: Not on file  Occupational History   Not on file  Tobacco Use   Smoking status: Never    Passive exposure: Never   Smokeless tobacco: Never  Vaping Use   Vaping status: Never Used  Substance and Sexual Activity   Alcohol use: Yes    Comment: occasional   Drug use: No   Sexual activity: Yes    Partners: Male    Birth control/protection: Surgical    Comment: TUBAL LIGATION,declined insurance questions,  Other Topics Concern   Not on file  Social History Narrative   Not on file   Social Determinants of Health   Financial Resource Strain: Not on file  Food Insecurity: Not on file  Transportation Needs: Not on file  Physical Activity: Not on file  Stress: Not on file  Social Connections: Not on file  Intimate Partner Violence: Not on file     FAMILY HISTORY: Family History  Problem Relation Age of Onset   Hypertension Mother    Stroke Mother    Hypertension Father    Hypertension Brother    Breast cancer Paternal Aunt     ALLERGIES:  is allergic to nitrofurantoin monohyd macro and sudafed [pseudoephedrine hcl].  MEDICATIONS:  Current Outpatient Medications  Medication Sig Dispense Refill   amLODipine-benazepril (LOTREL) 5-10 MG capsule Take 1 capsule by mouth daily.     cetirizine (ZYRTEC) 10 MG tablet Take 10 mg by mouth daily. Reported on 05/05/2015     clonazePAM (KLONOPIN) 0.5 MG tablet Take 0.5 mg by mouth 2 (two) times daily  as needed for anxiety. Reported on 05/05/2015     JARDIANCE 10 MG TABS tablet Take 10 mg by mouth every morning.     norethindrone (MICRONOR) 0.35 MG tablet Take 1 tablet (0.35 mg total) by mouth daily. 84 tablet 3   rivaroxaban (XARELTO) 20 MG TABS tablet Take 1 tablet (20 mg total) by mouth daily with supper. 90 tablet 0   RIVAROXABAN (XARELTO) VTE STARTER PACK (15 & 20 MG) Follow package directions: Take one 15mg  tablet by mouth twice a day. On day 22, switch to one 20mg  tablet once a day. Take with food. 51 each 0   triamcinolone cream (KENALOG) 0.1 % Apply topically 2 (two) times daily as needed.     No current facility-administered medications for this visit.    REVIEW OF SYSTEMS:    10 Point review of Systems was done is negative except as noted above.   PHYSICAL EXAMINATION:  Vitals:   11/10/22 1251  BP: 139/79  Pulse: 79  Resp: 20  Temp: (!) 97 F (36.1 C)  SpO2: 99%     Filed Weights   11/10/22 1251  Weight: 212 lb 1.6 oz (96.2 kg)     .Body mass index is 32.73 kg/m.  GENERAL:alert, in no acute distress and comfortable SKIN: no acute rashes, no significant lesions EYES: conjunctiva are pink and non-injected, sclera anicteric OROPHARYNX: MMM, no exudates, no oropharyngeal erythema or ulceration NECK: supple, no JVD LYMPH:  no palpable lymphadenopathy in the  cervical, axillary or inguinal regions LUNGS: clear to auscultation b/l with normal respiratory effort HEART: regular rate & rhythm ABDOMEN:  normoactive bowel sounds , non tender, not distended. Extremity: no pedal edema PSYCH: alert & oriented x 3 with fluent speech NEURO: no focal motor/sensory deficits   LABORATORY DATA:  I have reviewed the data as listed  .    Latest Ref Rng & Units 11/10/2022   12:31 PM 07/07/2022    3:10 PM 11/24/2020   10:08 AM  CBC  WBC 4.0 - 10.5 K/uL 4.2  4.6  6.0   Hemoglobin 12.0 - 15.0 g/dL 16.1  09.6  04.5   Hematocrit 36.0 - 46.0 % 40.4  40.0  41.6   Platelets 150 - 400 K/uL 306  326  373    . CBC    Component Value Date/Time   WBC 4.2 11/10/2022 1231   WBC 6.0 11/24/2020 1008   RBC 4.34 11/10/2022 1231   HGB 13.6 11/10/2022 1231   HCT 40.4 11/10/2022 1231   PLT 306 11/10/2022 1231   MCV 93.1 11/10/2022 1231   MCH 31.3 11/10/2022 1231   MCHC 33.7 11/10/2022 1231   RDW 12.6 11/10/2022 1231   LYMPHSABS 2.0 11/10/2022 1231   MONOABS 0.3 11/10/2022 1231   EOSABS 0.2 11/10/2022 1231   BASOSABS 0.0 11/10/2022 1231    .    Latest Ref Rng & Units 07/07/2022    3:10 PM 05/20/2020   11:31 AM 10/28/2019    6:58 AM  CMP  Glucose 70 - 99 mg/dL 80  81    81  98   BUN 6 - 20 mg/dL 16  18    18  17    Creatinine 0.44 - 1.00 mg/dL 4.09  8.11    9.14  7.82   Sodium 135 - 145 mmol/L 141  140    140  142   Potassium 3.5 - 5.1 mmol/L 4.0  4.5    4.5  3.9   Chloride 98 - 111  mmol/L 107  104    104  104   CO2 22 - 32 mmol/L 28  28    28     Calcium 8.9 - 10.3 mg/dL 9.2  9.8    9.8    Total Protein 6.5 - 8.1 g/dL 7.5  7.7    7.7    Total Bilirubin 0.3 - 1.2 mg/dL 0.4  0.4    0.4    Alkaline Phos 38 - 126 U/L 72     AST 15 - 41 U/L 40  19    19    ALT 0 - 44 U/L 24  16    16      Factor 5 leiden  Order: [id number]  Status:  Final result   Visible to patient:  No (Not Released) Next appt:  None Dx:  Primary hypercoagulable state (HCC); ...   Component 12d ago  Recommendations-F5LEID: Comment   Comment: (NOTE)  Result:  Negative (no mutation found)        Prothrombin gene mutation  Order: [id number]  Status:  Final result   Visible to patient:  No (Not Released) Next appt:  None Dx:  Primary hypercoagulable state (HCC); ...  Component 12d ago  Recommendations-PTGENE: Comment   Comment: (NOTE)  NEGATIVE  No mutation identified.        Component     Latest Ref Rng & Units 06/07/2017  Beta-2 Glycoprotein I Ab, IgG     0 - 20 GPI IgG units <9  Beta-2-Glycoprotein I IgM     0 - 32 GPI IgM units <9  Beta-2-Glycoprotein I IgA     0 - 25 GPI IgA units <9  Anticardiolipin Ab,IgG,Qn     0 - 14 GPL U/mL <9  Anticardiolipin Ab,IgM,Qn     0 - 12 MPL U/mL <9  Anticardiolipin Ab,IgA,Qn     0 - 11 APL U/mL <9  Protein S-Functional     63 - 140 % 58 (L)  Protein C-Functional     73 - 180 % 94    Lupus anticoagulant panel  Order: [id number]  Status:  Edited Result - FINAL   Visible to patient:  No (Not Released) Next appt:  None Dx:  Primary hypercoagulable state (HCC); ...   Ref Range & Units 12d ago  PTT Lupus Anticoagulant 0.0 - 51.9 sec 33.2   DRVVT 0.0 - 47.0 sec 55.6High    Lupus Anticoag Interp  Comment: VC  Comment: (NOTE)  Results are consistent with the presence of a lupus anticoagulant.  NOTE: Only persistent lupus anticoagulants are thought to be of  clinical  significance         RADIOGRAPHIC STUDIES: I have personally reviewed the radiological images as listed and agreed with the findings in the report. No results found.  ASSESSMENT & PLAN:  52 y.o. female with  1. RLE acute DVT of popliteal and calf veins. ?primary trigger factor Contributing factors - obesity .Body mass index is 32.73 kg/m., prolonged sitting for 8-10 hrs per day.  2) Anemia -normocytic anemia Hgb 10.9.. Heavy periods.  3. LUE acute DVT of innominate vein on the left with nonocclusive thrombus and thrombus in the basilic  vein.   PLAN:  -Discussed lab results on 11/10/22 in detail with patient. CBC normal, showed WBC of 4.2K, hemoglobin of 13.6, and platelets of 306K. -protein S activity levels in June had normalized -protein S lab today is pending. If protein S level is normal, than it would not be a  risk factor for blood clots -Systemic progesterone may have been a soft triggering event which is no longer a concern. Her second blood clot did occur while she was on aspirin, which did not suggest that Aspirin was protective enough. Patient does have a fhx of blood clots.  -Reasonable to switch to a preventative dose of Xarelto at this point to balance the risk bleeding in the long term while being more protective than baby aspirin.  -patient is agreeable to switching to a preventative dose of blood thinners -will switch to a preventative dose of blood thinners. She shall continue to have her blood thinners managed with her PCP in the long term -patient has no new symptoms suggestive of any other evolving cancer -continue to stay hydrated -continue to move around regularly -discussed option of using graded sports compression socks -patent is UTD with her age-appropriate cancer screenings, including mammogram, pap smear, and colonoscopy -continue to follow-up with PCP and OB/GYN regularly for optimal care  FOLLOW-UP: ***  The total time spent in the appointment was *** minutes* .  All of the patient's questions were answered with apparent satisfaction. The patient knows to call the clinic with any problems, questions or concerns.   Wyvonnia Lora MD MS AAHIVMS [facility] Hematology/Oncology Physician [facility]  .*Total Encounter Time as defined by the Centers for Medicare and Medicaid Services includes, in addition to the face-to-face time of a patient visit (documented in the note above) non-face-to-face time: obtaining and reviewing outside history, ordering and reviewing medications, tests or  procedures, care coordination (communications with other health care professionals or caregivers) and documentation in the medical record.    I,Mitra Faeizi,acting as a Neurosurgeon for Wyvonnia Lora, MD.,have documented all relevant documentation on the behalf of Wyvonnia Lora, MD,as directed by  Wyvonnia Lora, MD while in the presence of Wyvonnia Lora, MD.  ***

## 2022-11-12 LAB — PROTEIN S PANEL
Protein S Activity: 121 % (ref 63–140)
Protein S Ag, Free: 87 % (ref 61–136)
Protein S Ag, Total: 80 % (ref 60–150)

## 2022-11-16 MED ORDER — RIVAROXABAN 10 MG PO TABS: 10.0000 mg | ORAL_TABLET | Freq: Every day | ORAL | 2 refills | Status: AC

## 2022-11-30 ENCOUNTER — Ambulatory Visit
Admission: RE | Admit: 2022-11-30 | Discharge: 2022-11-30 | Disposition: A | Discharge status: Home / Self Care | Payer: BC Managed Care – PPO | Source: Ambulatory Visit | Attending: Nurse Practitioner | Admitting: Nurse Practitioner

## 2022-11-30 DIAGNOSIS — E041 Nontoxic single thyroid nodule: Secondary | ICD-10-CM | POA: Diagnosis not present

## 2022-12-08 DIAGNOSIS — R7309 Other abnormal glucose: Secondary | ICD-10-CM | POA: Diagnosis not present

## 2022-12-08 DIAGNOSIS — E041 Nontoxic single thyroid nodule: Secondary | ICD-10-CM | POA: Diagnosis not present

## 2022-12-14 DIAGNOSIS — R7301 Impaired fasting glucose: Secondary | ICD-10-CM | POA: Diagnosis not present

## 2022-12-14 DIAGNOSIS — I10 Essential (primary) hypertension: Secondary | ICD-10-CM | POA: Diagnosis not present

## 2022-12-14 DIAGNOSIS — E041 Nontoxic single thyroid nodule: Secondary | ICD-10-CM | POA: Diagnosis not present

## 2022-12-14 DIAGNOSIS — N1831 Chronic kidney disease, stage 3a: Secondary | ICD-10-CM | POA: Diagnosis not present

## 2022-12-29 DIAGNOSIS — N1831 Chronic kidney disease, stage 3a: Secondary | ICD-10-CM | POA: Diagnosis not present

## 2022-12-29 DIAGNOSIS — Z23 Encounter for immunization: Secondary | ICD-10-CM | POA: Diagnosis not present

## 2022-12-29 DIAGNOSIS — Z0000 Encounter for general adult medical examination without abnormal findings: Secondary | ICD-10-CM | POA: Diagnosis not present

## 2022-12-29 DIAGNOSIS — F419 Anxiety disorder, unspecified: Secondary | ICD-10-CM | POA: Diagnosis not present

## 2022-12-29 DIAGNOSIS — I129 Hypertensive chronic kidney disease with stage 1 through stage 4 chronic kidney disease, or unspecified chronic kidney disease: Secondary | ICD-10-CM | POA: Diagnosis not present

## 2022-12-29 DIAGNOSIS — R7303 Prediabetes: Secondary | ICD-10-CM | POA: Diagnosis not present

## 2023-01-03 ENCOUNTER — Ambulatory Visit (HOSPITAL_COMMUNITY)
Admission: RE | Admit: 2023-01-03 | Discharge: 2023-01-03 | Disposition: A | Discharge status: Home / Self Care | Payer: BC Managed Care – PPO | Source: Ambulatory Visit | Attending: Vascular Surgery | Admitting: Vascular Surgery

## 2023-01-03 ENCOUNTER — Other Ambulatory Visit (HOSPITAL_COMMUNITY): Payer: Self-pay

## 2023-01-03 ENCOUNTER — Encounter (HOSPITAL_COMMUNITY): Payer: Self-pay

## 2023-01-03 DIAGNOSIS — M79622 Pain in left upper arm: Secondary | ICD-10-CM | POA: Diagnosis not present

## 2023-01-03 DIAGNOSIS — Z86718 Personal history of other venous thrombosis and embolism: Secondary | ICD-10-CM

## 2023-01-07 ENCOUNTER — Other Ambulatory Visit: Payer: Self-pay | Admitting: Hematology

## 2023-01-07 DIAGNOSIS — I82622 Acute embolism and thrombosis of deep veins of left upper extremity: Secondary | ICD-10-CM

## 2023-03-22 DIAGNOSIS — Z23 Encounter for immunization: Secondary | ICD-10-CM | POA: Diagnosis not present

## 2023-03-23 DIAGNOSIS — L304 Erythema intertrigo: Secondary | ICD-10-CM | POA: Diagnosis not present

## 2023-03-23 DIAGNOSIS — D2372 Other benign neoplasm of skin of left lower limb, including hip: Secondary | ICD-10-CM | POA: Diagnosis not present

## 2023-12-14 ENCOUNTER — Other Ambulatory Visit: Payer: Self-pay | Admitting: Nurse Practitioner

## 2023-12-14 DIAGNOSIS — E041 Nontoxic single thyroid nodule: Secondary | ICD-10-CM
